# Patient Record
Sex: Female | Born: 1935 | Race: White | Hispanic: No | Smoking: Former smoker
Health system: Southern US, Community
[De-identification: ages and names within clinical notes are randomized; demographics above are authoritative.]

## PROBLEM LIST (undated history)

## (undated) DIAGNOSIS — R059 Cough, unspecified: Secondary | ICD-10-CM

## (undated) DIAGNOSIS — R928 Other abnormal and inconclusive findings on diagnostic imaging of breast: Secondary | ICD-10-CM

## (undated) DIAGNOSIS — K219 Gastro-esophageal reflux disease without esophagitis: Secondary | ICD-10-CM

## (undated) DIAGNOSIS — C801 Malignant (primary) neoplasm, unspecified: Secondary | ICD-10-CM

## (undated) DIAGNOSIS — K5790 Diverticulosis of intestine, part unspecified, without perforation or abscess without bleeding: Secondary | ICD-10-CM

## (undated) DIAGNOSIS — H9190 Unspecified hearing loss, unspecified ear: Secondary | ICD-10-CM

## (undated) DIAGNOSIS — N63 Unspecified lump in unspecified breast: Secondary | ICD-10-CM

## (undated) DIAGNOSIS — M1990 Unspecified osteoarthritis, unspecified site: Secondary | ICD-10-CM

## (undated) DIAGNOSIS — K2270 Barrett's esophagus without dysplasia: Secondary | ICD-10-CM

## (undated) DIAGNOSIS — R05 Cough: Secondary | ICD-10-CM

## (undated) DIAGNOSIS — I10 Essential (primary) hypertension: Secondary | ICD-10-CM

## (undated) HISTORY — PX: COLON SURGERY: SHX602

## (undated) HISTORY — DX: Gastro-esophageal reflux disease without esophagitis: K21.9

## (undated) HISTORY — PX: ABDOMINAL HYSTERECTOMY: SHX81

## (undated) HISTORY — DX: Cough: R05

## (undated) HISTORY — DX: Essential (primary) hypertension: I10

## (undated) HISTORY — DX: Unspecified lump in unspecified breast: N63.0

## (undated) HISTORY — DX: Cough, unspecified: R05.9

## (undated) HISTORY — DX: Unspecified osteoarthritis, unspecified site: M19.90

## (undated) HISTORY — DX: Other abnormal and inconclusive findings on diagnostic imaging of breast: R92.8

## (undated) HISTORY — DX: Malignant (primary) neoplasm, unspecified: C80.1

## (undated) HISTORY — DX: Diverticulosis of intestine, part unspecified, without perforation or abscess without bleeding: K57.90

## (undated) HISTORY — PX: APPENDECTOMY: SHX54

## (undated) HISTORY — DX: Barrett's esophagus without dysplasia: K22.70

---

## 1963-04-25 DIAGNOSIS — M1990 Unspecified osteoarthritis, unspecified site: Secondary | ICD-10-CM

## 1963-04-25 HISTORY — DX: Unspecified osteoarthritis, unspecified site: M19.90

## 1998-09-01 ENCOUNTER — Other Ambulatory Visit: Admission: RE | Admit: 1998-09-01 | Discharge: 1998-09-01 | Payer: Self-pay | Admitting: Obstetrics & Gynecology

## 1999-09-08 ENCOUNTER — Other Ambulatory Visit: Admission: RE | Admit: 1999-09-08 | Discharge: 1999-09-08 | Payer: Self-pay | Admitting: Obstetrics & Gynecology

## 1999-10-11 ENCOUNTER — Encounter: Admission: RE | Admit: 1999-10-11 | Discharge: 1999-10-11 | Payer: Self-pay | Admitting: Obstetrics & Gynecology

## 1999-10-11 ENCOUNTER — Encounter: Payer: Self-pay | Admitting: Obstetrics & Gynecology

## 1999-11-04 ENCOUNTER — Ambulatory Visit (HOSPITAL_COMMUNITY): Admission: RE | Admit: 1999-11-04 | Discharge: 1999-11-04 | Payer: Self-pay | Admitting: Gastroenterology

## 2000-11-29 ENCOUNTER — Other Ambulatory Visit: Admission: RE | Admit: 2000-11-29 | Discharge: 2000-11-29 | Payer: Self-pay | Admitting: Obstetrics & Gynecology

## 2002-07-28 ENCOUNTER — Encounter: Payer: Self-pay | Admitting: Unknown Physician Specialty

## 2002-07-28 ENCOUNTER — Encounter: Admission: RE | Admit: 2002-07-28 | Discharge: 2002-07-28 | Payer: Self-pay | Admitting: Unknown Physician Specialty

## 2003-08-03 ENCOUNTER — Encounter: Admission: RE | Admit: 2003-08-03 | Discharge: 2003-08-03 | Payer: Self-pay | Admitting: Unknown Physician Specialty

## 2003-12-01 ENCOUNTER — Observation Stay (HOSPITAL_COMMUNITY): Admission: EM | Admit: 2003-12-01 | Discharge: 2003-12-02 | Payer: Self-pay | Admitting: Emergency Medicine

## 2005-03-22 ENCOUNTER — Ambulatory Visit (HOSPITAL_COMMUNITY): Admission: RE | Admit: 2005-03-22 | Discharge: 2005-03-22 | Payer: Self-pay | Admitting: Gastroenterology

## 2006-01-08 ENCOUNTER — Emergency Department (HOSPITAL_COMMUNITY): Admission: EM | Admit: 2006-01-08 | Discharge: 2006-01-09 | Payer: Self-pay | Admitting: Emergency Medicine

## 2006-04-24 DIAGNOSIS — K5790 Diverticulosis of intestine, part unspecified, without perforation or abscess without bleeding: Secondary | ICD-10-CM

## 2006-04-24 HISTORY — DX: Diverticulosis of intestine, part unspecified, without perforation or abscess without bleeding: K57.90

## 2007-04-25 DIAGNOSIS — K2270 Barrett's esophagus without dysplasia: Secondary | ICD-10-CM

## 2007-04-25 HISTORY — DX: Barrett's esophagus without dysplasia: K22.70

## 2007-06-23 ENCOUNTER — Emergency Department (HOSPITAL_COMMUNITY): Admission: EM | Admit: 2007-06-23 | Discharge: 2007-06-24 | Payer: Self-pay | Admitting: Emergency Medicine

## 2009-05-01 ENCOUNTER — Emergency Department (HOSPITAL_BASED_OUTPATIENT_CLINIC_OR_DEPARTMENT_OTHER): Admission: EM | Admit: 2009-05-01 | Discharge: 2009-05-01 | Payer: Self-pay | Admitting: Emergency Medicine

## 2010-04-24 DIAGNOSIS — C801 Malignant (primary) neoplasm, unspecified: Secondary | ICD-10-CM

## 2010-04-24 DIAGNOSIS — I10 Essential (primary) hypertension: Secondary | ICD-10-CM

## 2010-04-24 HISTORY — DX: Malignant (primary) neoplasm, unspecified: C80.1

## 2010-04-24 HISTORY — DX: Essential (primary) hypertension: I10

## 2010-05-15 ENCOUNTER — Encounter: Payer: Self-pay | Admitting: Internal Medicine

## 2010-07-10 LAB — DIFFERENTIAL
Basophils Relative: 1 % (ref 0–1)
Eosinophils Absolute: 0 10*3/uL (ref 0.0–0.7)
Eosinophils Relative: 0 % (ref 0–5)
Lymphocytes Relative: 21 % (ref 12–46)
Lymphs Abs: 1.8 10*3/uL (ref 0.7–4.0)
Neutro Abs: 6.5 10*3/uL (ref 1.7–7.7)
Neutrophils Relative %: 75 % (ref 43–77)

## 2010-07-10 LAB — COMPREHENSIVE METABOLIC PANEL
AST: 19 U/L (ref 0–37)
Albumin: 4.1 g/dL (ref 3.5–5.2)
Alkaline Phosphatase: 57 U/L (ref 39–117)
CO2: 27 mEq/L (ref 19–32)
Chloride: 104 mEq/L (ref 96–112)
Creatinine, Ser: 0.8 mg/dL (ref 0.4–1.2)
Glucose, Bld: 141 mg/dL — ABNORMAL HIGH (ref 70–99)
Total Bilirubin: 0.6 mg/dL (ref 0.3–1.2)
Total Protein: 6.9 g/dL (ref 6.0–8.3)

## 2010-07-10 LAB — URINALYSIS, ROUTINE W REFLEX MICROSCOPIC
Ketones, ur: 15 mg/dL — AB
Protein, ur: NEGATIVE mg/dL

## 2010-07-10 LAB — CBC
Platelets: 236 10*3/uL (ref 150–400)
RBC: 3.81 MIL/uL — ABNORMAL LOW (ref 3.87–5.11)
RDW: 12.8 % (ref 11.5–15.5)
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
## 2010-09-09 NOTE — Procedures (Signed)
[facility]  Patient:    Kristen Allen, Kristen Allen                       MRN: [id number] Proc. Date: 11/04/99 Adm. Date:  [id number] Disc. Date: [id number] Attending:  Louie Bun CC:         Lennis P. Darrold Span, M.D.                           Procedure Report  PROCEDURE:  Colonoscopy.  INDICATION FOR PROCEDURE:  History of colon cancer with last surveillance colonoscopy three years ago.  DESCRIPTION OF PROCEDURE:  The patient was placed in the left lateral decubitus position and placed on the pulse monitor with continuous low-flow oxygen delivered by nasal cannula.  She was sedated with 70 mg IV Demerol and 7 mg IV Versed.  The Olympus video colonoscope was inserted into the rectum and advanced to the ileocolonic surgical anastomosis in the right upper abdomen.  This was confirmed by intubation of the ileum.  The prep was excellent.  The proximal portions of the colon as well as the remaining transverse, descending, and sigmoid colon all appeared normal with no masses, polyps, diverticula, or other mucosal abnormalities.  The rectum likewise appeared normal, and retroflex view of the anus did reveal some small internal hemorrhoids.  The colonoscope was then withdrawn and the patient returned to the recovery room in stable condition.  The patient tolerated the procedure well, and there were no immediate complications.  IMPRESSION:  Internal hemorrhoids, otherwise normal colonoscopy.  PLAN:  Repeat colonoscopy in five years. DD:  11/04/99 TD:  11/04/99 Job: [id number]
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
## 2010-09-09 NOTE — Op Note (Signed)
NAMEBRYLA, Kristen Allen                ACCOUNT NO.:  [id number]   MEDICAL RECORD NO.:  [id number]          PATIENT TYPE:  AMB   LOCATION:  ENDO                         [facility]AC[facility]  [facility]   PHYSICIAN:  John C. Madilyn Fireman, M.D.    DATE OF BIRTH:  1931/09/26   DATE OF PROCEDURE:  03/22/2005  DATE OF DISCHARGE:                                 OPERATIVE REPORT   PROCEDURE:  Colonoscopy.   ENDOSCOPIST:  Everardo All. Madilyn Fireman, M.D.   INDICATIONS FOR PROCEDURE:  History of colon cancer with last surveillance  colonoscopy five years ago.   PROCEDURE:  The patient was placed in the left lateral decubitus position  and placed on the pulse monitor  He was sedated with her. She was sedated  with 75 mcg IV fentanyl and 7.5 mg IV Versed.  The Olympus video colonoscope  was inserted into the rectum and advanced to the ileocolonic anastomosis  which was easily identified and photographed.  In the right lower quadrant,  the neoterminal was intubated.  It appeared normal. The ileocolonic  anastomosis all appeared normal.  The visualized portions of the remaining  descending and transverse as well as the descending sigmoid and rectum all  appeared normal with no masses, polyps, diverticula or other mucosal  abnormalities.  The rectum appeared normal on retroflexed view.  The anus  revealed no obvious internal hemorrhoids. The scope was then withdrawn and  the patient returned to the recovery room in stable condition.  The patient  did experience some discomfort on advancement of the scope to the  ileocolonic anastomosis but otherwise tolerated the procedure well, and  there were no immediate complications.   IMPRESSION:  Normal colonoscopy, status post right hemicolectomy in 1993 for  colon cancer.   PLAN:  Repeat study in five years.           ______________________________  Everardo All Madilyn Fireman, M.D.     JCH/MEDQ  D:  03/22/2005  T:  03/22/2005  Job:  [id number]   cc:   Lennis P. Darrold Span, M.D.  Fax: [phone]

## 2010-10-03 ENCOUNTER — Emergency Department (HOSPITAL_BASED_OUTPATIENT_CLINIC_OR_DEPARTMENT_OTHER)
Admission: EM | Admit: 2010-10-03 | Discharge: 2010-10-03 | Disposition: A | Discharge status: Home / Self Care | Payer: Medicare Other | Attending: Emergency Medicine | Admitting: Emergency Medicine

## 2010-10-03 DIAGNOSIS — K219 Gastro-esophageal reflux disease without esophagitis: Secondary | ICD-10-CM | POA: Insufficient documentation

## 2010-10-03 DIAGNOSIS — I10 Essential (primary) hypertension: Secondary | ICD-10-CM | POA: Insufficient documentation

## 2010-10-03 DIAGNOSIS — N390 Urinary tract infection, site not specified: Secondary | ICD-10-CM | POA: Insufficient documentation

## 2010-10-03 DIAGNOSIS — R300 Dysuria: Secondary | ICD-10-CM | POA: Insufficient documentation

## 2010-10-03 LAB — URINALYSIS, ROUTINE W REFLEX MICROSCOPIC
Bilirubin Urine: NEGATIVE
Glucose, UA: NEGATIVE mg/dL
Nitrite: NEGATIVE
Protein, ur: 30 mg/dL — AB
Urobilinogen, UA: 0.2 mg/dL (ref 0.0–1.0)
pH: 6 (ref 5.0–8.0)

## 2010-10-03 LAB — URINE MICROSCOPIC-ADD ON

## 2010-10-05 LAB — URINE CULTURE: Colony Count: >=100000

## 2011-01-16 LAB — DIFFERENTIAL
Basophils Absolute: 0
Basophils Relative: 0
Eosinophils Absolute: 0
Eosinophils Relative: 0
Lymphocytes Relative: 4 — ABNORMAL LOW
Lymphs Abs: 0.3 — ABNORMAL LOW
Monocytes Absolute: 0 — ABNORMAL LOW
Neutro Abs: 6.4

## 2011-01-16 LAB — COMPREHENSIVE METABOLIC PANEL
Calcium: 8.9
Potassium: 3.3 — ABNORMAL LOW
Sodium: 141

## 2011-01-16 LAB — LIPASE, BLOOD: Lipase: 22

## 2011-01-16 LAB — URINALYSIS, ROUTINE W REFLEX MICROSCOPIC
Ketones, ur: NEGATIVE
Nitrite: NEGATIVE
Specific Gravity, Urine: 1.014

## 2011-01-16 LAB — CBC
HCT: 33 — ABNORMAL LOW
Hemoglobin: 11.4 — ABNORMAL LOW
MCHC: 34.6
Platelets: 217
RBC: 3.58 — ABNORMAL LOW

## 2012-05-23 ENCOUNTER — Encounter: Payer: Self-pay | Admitting: *Deleted

## 2017-11-02 ENCOUNTER — Other Ambulatory Visit: Payer: Self-pay | Admitting: Family Medicine

## 2017-11-02 ENCOUNTER — Ambulatory Visit: Payer: Self-pay

## 2017-11-02 DIAGNOSIS — M25561 Pain in right knee: Secondary | ICD-10-CM

## 2018-01-16 ENCOUNTER — Emergency Department (HOSPITAL_BASED_OUTPATIENT_CLINIC_OR_DEPARTMENT_OTHER)
Admission: EM | Admit: 2018-01-16 | Discharge: 2018-01-16 | Disposition: A | Discharge status: Home / Self Care | Payer: Medicare Other | Attending: Emergency Medicine | Admitting: Emergency Medicine

## 2018-01-16 ENCOUNTER — Emergency Department (HOSPITAL_BASED_OUTPATIENT_CLINIC_OR_DEPARTMENT_OTHER): Payer: Medicare Other

## 2018-01-16 ENCOUNTER — Encounter (HOSPITAL_BASED_OUTPATIENT_CLINIC_OR_DEPARTMENT_OTHER): Payer: Self-pay | Admitting: Emergency Medicine

## 2018-01-16 ENCOUNTER — Other Ambulatory Visit: Payer: Self-pay

## 2018-01-16 DIAGNOSIS — R112 Nausea with vomiting, unspecified: Secondary | ICD-10-CM | POA: Insufficient documentation

## 2018-01-16 DIAGNOSIS — Z79899 Other long term (current) drug therapy: Secondary | ICD-10-CM | POA: Diagnosis not present

## 2018-01-16 DIAGNOSIS — R509 Fever, unspecified: Secondary | ICD-10-CM | POA: Insufficient documentation

## 2018-01-16 DIAGNOSIS — Z853 Personal history of malignant neoplasm of breast: Secondary | ICD-10-CM | POA: Insufficient documentation

## 2018-01-16 DIAGNOSIS — M545 Low back pain, unspecified: Secondary | ICD-10-CM

## 2018-01-16 DIAGNOSIS — Z87891 Personal history of nicotine dependence: Secondary | ICD-10-CM | POA: Diagnosis not present

## 2018-01-16 DIAGNOSIS — I10 Essential (primary) hypertension: Secondary | ICD-10-CM | POA: Insufficient documentation

## 2018-01-16 LAB — CBC WITH DIFFERENTIAL/PLATELET
Basophils Absolute: 0 10*3/uL (ref 0.0–0.1)
Basophils Relative: 0 %
EOS ABS: 0 10*3/uL (ref 0.0–0.7)
Eosinophils Relative: 2 %
HEMATOCRIT: 36.6 % (ref 36.0–46.0)
HEMOGLOBIN: 12 g/dL (ref 12.0–15.0)
LYMPHS PCT: 56 %
Lymphs Abs: 0.8 10*3/uL (ref 0.7–4.0)
MCH: 31.1 pg (ref 26.0–34.0)
MCHC: 32.8 g/dL (ref 30.0–36.0)
MCV: 94.8 fL (ref 78.0–100.0)
MONOS PCT: 0 %
Monocytes Absolute: 0 10*3/uL — ABNORMAL LOW (ref 0.1–1.0)
NEUTROS ABS: 0.5 10*3/uL — AB (ref 1.7–7.7)
NEUTROS PCT: 42 %
Platelets: 162 10*3/uL (ref 150–400)
RBC: 3.86 MIL/uL — AB (ref 3.87–5.11)
RDW: 14.7 % (ref 11.5–15.5)
WBC: 1.3 10*3/uL — AB (ref 4.0–10.5)

## 2018-01-16 LAB — COMPREHENSIVE METABOLIC PANEL
ALK PHOS: 74 U/L (ref 38–126)
ALT: 24 U/L (ref 0–44)
ANION GAP: 10 (ref 5–15)
AST: 37 U/L (ref 15–41)
Albumin: 4.1 g/dL (ref 3.5–5.0)
BILIRUBIN TOTAL: 0.5 mg/dL (ref 0.3–1.2)
BUN: 19 mg/dL (ref 8–23)
CALCIUM: 9.2 mg/dL (ref 8.9–10.3)
CO2: 25 mmol/L (ref 22–32)
Chloride: 109 mmol/L (ref 98–111)
Creatinine, Ser: 1.06 mg/dL — ABNORMAL HIGH (ref 0.44–1.00)
GFR calc Af Amer: 54 mL/min — ABNORMAL LOW (ref >60)
GFR calc non Af Amer: 47 mL/min — ABNORMAL LOW (ref >60)
GLUCOSE: 110 mg/dL — AB (ref 70–99)
Potassium: 3 mmol/L — ABNORMAL LOW (ref 3.5–5.1)
SODIUM: 144 mmol/L (ref 135–145)
TOTAL PROTEIN: 6.8 g/dL (ref 6.5–8.1)

## 2018-01-16 LAB — URINALYSIS, ROUTINE W REFLEX MICROSCOPIC
Bilirubin Urine: NEGATIVE
GLUCOSE, UA: NEGATIVE mg/dL
KETONES UR: NEGATIVE mg/dL
LEUKOCYTES UA: NEGATIVE
NITRITE: NEGATIVE
PROTEIN: NEGATIVE mg/dL
Specific Gravity, Urine: <1.005 — ABNORMAL LOW (ref 1.005–1.030)
pH: 5.5 (ref 5.0–8.0)

## 2018-01-16 LAB — URINALYSIS, MICROSCOPIC (REFLEX)

## 2018-01-16 LAB — I-STAT CG4 LACTIC ACID, ED: LACTIC ACID, VENOUS: 3.5 mmol/L — AB (ref 0.5–1.9)

## 2018-01-16 LAB — LIPASE, BLOOD: Lipase: 42 U/L (ref 11–51)

## 2018-01-16 LAB — TROPONIN I: Troponin I: <0.03 ng/mL (ref <0.03)

## 2018-01-16 MED ORDER — ACETAMINOPHEN 500 MG PO TABS
1000.0000 mg | ORAL_TABLET | Freq: Once | ORAL | Status: AC
  Administered 2018-01-16: 1000 mg via ORAL
  Filled 2018-01-16: qty 2

## 2018-01-16 MED ORDER — SODIUM CHLORIDE 0.9 % IV BOLUS
1000.0000 mL | Freq: Once | INTRAVENOUS | Status: AC
  Administered 2018-01-16: 1000 mL via INTRAVENOUS

## 2018-01-16 MED ORDER — IOPAMIDOL (ISOVUE-300) INJECTION 61%
80.0000 mL | Freq: Once | INTRAVENOUS | Status: AC | PRN
  Administered 2018-01-16: 80 mL via INTRAVENOUS

## 2018-01-16 MED ORDER — ONDANSETRON 4 MG PO TBDP: ORAL_TABLET | ORAL | 0 refills | Status: DC

## 2018-01-16 MED ORDER — IBUPROFEN 800 MG PO TABS
800.0000 mg | ORAL_TABLET | Freq: Once | ORAL | Status: AC
  Administered 2018-01-16: 800 mg via ORAL
  Filled 2018-01-16: qty 1

## 2018-01-16 MED ORDER — ONDANSETRON HCL 4 MG/2ML IJ SOLN
4.0000 mg | Freq: Once | INTRAMUSCULAR | Status: AC
  Administered 2018-01-16: 4 mg via INTRAVENOUS
  Filled 2018-01-16: qty 2

## 2018-01-16 NOTE — ED Triage Notes (Signed)
Pt states she got a flu shot yesterday and this evening around 1815 she started having chills, with nausea and vomiting  Pt had a loose BM once she got here  Pt states she feels weak all over

## 2018-01-16 NOTE — Discharge Instructions (Signed)
Take 4 over the counter ibuprofen tablets 3 times a day or 2 over-the-counter naproxen tablets twice a day for pain. Also take tylenol 1000mg (2 extra strength) four times a day.    Return to the ED for any worsening, inability to eat or drink. Please follow up with your family doc.

## 2018-01-16 NOTE — ED Notes (Signed)
Patient transported to CT 

## 2018-01-16 NOTE — ED Provider Notes (Signed)
[facility] EMERGENCY DEPARTMENT Provider Note   CSN: [id number] Arrival date & time: 01/16/18  1939     History   Chief Complaint Chief Complaint  Patient presents with  . Fever  . Emesis    HPI Kristen Allen is a 84 y.o. female.  84 yo F with a chief complaint of nausea and vomiting.  Going on for the last hour.  The family states that she is looked very pale with this.  She denies abdominal pain denies fevers denies diarrhea.  She has been previously constipated and felt that she had her first bowel movement today.  Denies chest pain or shortness of breath.  She is had some low back pain that she feels is worse with the vomiting worse with twisting or palpation.  Describes his back pain is similar to when she has her normal back aches when she does exercise or moves around the house.  The history is provided by the patient and a relative.  Fever   Associated symptoms include vomiting. Pertinent negatives include no chest pain, no congestion and no headaches.  Emesis   Pertinent negatives include no arthralgias, no chills, no fever, no headaches and no myalgias.  Illness  This is a new problem. The current episode started less than 1 hour ago. The problem occurs constantly. The problem has not changed since onset.Pertinent negatives include no chest pain, no headaches and no shortness of breath. Nothing aggravates the symptoms. Nothing relieves the symptoms. She has tried nothing for the symptoms. The treatment provided no relief.    Past Medical History:  Diagnosis Date  . Abnormal mammogram   . Arthritis 1965  . Barrett esophagus 2009  . Breast lump in female   . Cancer ([place]) 2012   squamous cell lower left breast  . Cough   . Diverticulosis 2008  . Esophageal reflux   . Hypertension 2012    There are no active problems to display for this patient.   Past Surgical History:  Procedure Laterality Date  . ABDOMINAL HYSTERECTOMY    . APPENDECTOMY    .  COLON SURGERY       OB History   None      Home Medications    Prior to Admission medications   Medication Sig Start Date End Date Taking? Authorizing Provider  Calcium Carbonate-Vitamin D (CALCIUM 500/D PO) Take 2 tablets by mouth daily.    [provider]  letrozole (FEMARA) 2.5 MG tablet Take 2.5 mg by mouth daily.    [provider]  metoprolol succinate (TOPROL-XL) 25 MG 24 hr tablet Take 25 mg by mouth 2 (two) times daily.    [provider]  ondansetron (ZOFRAN ODT) 4 MG disintegrating tablet 4mg  ODT q4 hours prn nausea/vomit 01/16/18   Deno Etienne, DO  pantoprazole (PROTONIX) 40 MG tablet Take 40 mg by mouth 2 (two) times daily.    [provider]  rosuvastatin (CRESTOR) 5 MG tablet Take 5 mg by mouth daily.    [provider]    Family History History reviewed. No pertinent family history.  Social History Social History   Tobacco Use  . Smoking status: Former Smoker    Packs/day: 1.00    Years: 30.00    Pack years: 30.00    Types: Cigarettes  . Smokeless tobacco: Never Used  Substance Use Topics  . Alcohol use: No  . Drug use: No     Allergies   Codeine and Erythromycin  Review of Systems Review of Systems  Constitutional: Negative for chills and fever.  HENT: Negative for congestion and rhinorrhea.   Eyes: Negative for redness and visual disturbance.  Respiratory: Negative for shortness of breath and wheezing.   Cardiovascular: Negative for chest pain and palpitations.  Gastrointestinal: Positive for nausea and vomiting.  Genitourinary: Negative for dysuria and urgency.  Musculoskeletal: Positive for back pain. Negative for arthralgias and myalgias.  Skin: Negative for pallor and wound.  Neurological: Negative for dizziness and headaches.     Physical Exam Updated Vital Signs BP 121/75 (BP Location: Left Arm)   Pulse 80   Temp 98.5 F (36.9 C) (Oral)   Resp 18   Ht 5' 6.5" (1.689 m)   Wt 68 kg    SpO2 93%   BMI 23.85 kg/m   Physical Exam  Constitutional: She is oriented to person, place, and time. She appears well-developed and well-nourished. No distress.  HENT:  Head: Normocephalic and atraumatic.  Eyes: Pupils are equal, round, and reactive to light. EOM are normal.  Neck: Normal range of motion. Neck supple.  Cardiovascular: Normal rate and regular rhythm. Exam reveals no gallop and no friction rub.  No murmur heard. Pulmonary/Chest: Effort normal. She has no wheezes. She has no rales.  Abdominal: Soft. She exhibits no distension and no mass. There is no tenderness. There is no guarding.  Musculoskeletal: She exhibits no edema or tenderness.  Neurological: She is alert and oriented to person, place, and time.  Skin: Skin is warm and dry. She is not diaphoretic.  Psychiatric: She has a normal mood and affect. Her behavior is normal.  Nursing note and vitals reviewed.    ED Treatments / Results  Labs (all labs ordered are listed, but only abnormal results are displayed) Labs Reviewed  CBC WITH DIFFERENTIAL/PLATELET - Abnormal; Notable for the following components:      Result Value   WBC 1.3 (*)    RBC 3.86 (*)    Neutro Abs 0.5 (*)    Monocytes Absolute 0.0 (*)    All other components within normal limits  COMPREHENSIVE METABOLIC PANEL - Abnormal; Notable for the following components:   Potassium 3.0 (*)    Glucose, Bld 110 (*)    Creatinine, Ser 1.06 (*)    GFR calc non Af Amer 47 (*)    GFR calc Af Amer 54 (*)    All other components within normal limits  URINALYSIS, ROUTINE W REFLEX MICROSCOPIC - Abnormal; Notable for the following components:   Specific Gravity, Urine <1.005 (*)    Hgb urine dipstick TRACE (*)    All other components within normal limits  URINALYSIS, MICROSCOPIC (REFLEX) - Abnormal; Notable for the following components:   Bacteria, UA RARE (*)    All other components within normal limits  I-STAT CG4 LACTIC ACID, ED - Abnormal; Notable for  the following components:   Lactic Acid, Venous 3.50 (*)    All other components within normal limits  LIPASE, BLOOD  TROPONIN I  PATHOLOGIST SMEAR REVIEW  I-STAT CG4 LACTIC ACID, ED    EKG EKG Interpretation  Date/Time:  Wednesday January 16 2018 20:13:00 EDT Ventricular Rate:  109 PR Interval:    QRS Duration: 104 QT Interval:  374 QTC Calculation: 504 R Axis:   93 Text Interpretation:  Sinus tachycardia Right axis deviation Prolonged QT interval No significant change since last tracing Confirmed by Deno Etienne [phone]) on 01/16/2018 8:16:43 PM   Radiology Dg Chest 2 View  Result Date: 01/16/2018 CLINICAL DATA:  Shortness of breath EXAM: CHEST - 2 VIEW COMPARISON:  06/26/2016 FINDINGS: Bibasilar atelectasis. Heart is normal size. No effusions or acute bony abnormality. IMPRESSION: Bibasilar atelectasis. Electronically Signed   By: Rolm Baptise M.D.   On: 01/16/2018 21:00   Ct Abdomen Pelvis W Contrast  Result Date: 01/16/2018 CLINICAL DATA:  Chills, nausea and vomiting after flu shot yesterday. Diarrhea. Weakness. History of breast cancer, hysterectomy, appendectomy in colon surgery. EXAM: CT ABDOMEN AND PELVIS WITH CONTRAST TECHNIQUE: Multidetector CT imaging of the abdomen and pelvis was performed using the standard protocol following bolus administration of intravenous contrast. CONTRAST:  27mL ISOVUE-300 IOPAMIDOL (ISOVUE-300) INJECTION 61% COMPARISON:  Chest radiograph June 23, 2007. FINDINGS: LOWER CHEST: Dependent atelectasis. Included heart size is normal. No pericardial effusion. HEPATOBILIARY: Liver and gallbladder are normal. Mild periportal edema, nonspecific and non suspicious. PANCREAS: Normal. SPLEEN: Normal. ADRENALS/URINARY TRACT: Kidneys are orthotopic, demonstrating symmetric enhancement. No nephrolithiasis, hydronephrosis or solid renal masses. The unopacified ureters are normal in course and caliber. Delayed imaging through the kidneys demonstrates symmetric prompt  contrast excretion within the proximal urinary collecting system. Urinary bladder is partially distended and unremarkable. Normal adrenal glands. STOMACH/BOWEL: Small to moderate hiatal hernia. The stomach, small bowel are normal in course and caliber without inflammatory changes. Status post RIGHT hemicolectomy. VASCULAR/LYMPHATIC: Aortoiliac vessels are normal in course and caliber. Mild calcific atherosclerosis. No lymphadenopathy by CT size criteria. REPRODUCTIVE: Status post hysterectomy. OTHER: No intraperitoneal free fluid or free air. MUSCULOSKELETAL: Nonacute. Moderate to severe L4-5 and L5-S1 degenerative disc. Anterior abdominal wall scarring. Tarlov cyst. IMPRESSION: 1. No acute intra-abdominal/pelvic process. 2. Small to moderate hiatal hernia. Status post RIGHT hemicolectomy. Aortic Atherosclerosis (ICD10-I70.0). Electronically Signed   By: Elon Alas M.D.   On: 01/16/2018 21:09    Procedures Procedures (including critical care time)  Medications Ordered in ED Medications  ondansetron (ZOFRAN) injection 4 mg (4 mg Intravenous Given 01/16/18 1959)  sodium chloride 0.9 % bolus 1,000 mL ( Intravenous Stopped 01/16/18 2239)  acetaminophen (TYLENOL) tablet 1,000 mg (1,000 mg Oral Given 01/16/18 2012)  iopamidol (ISOVUE-300) 61 % injection 80 mL (80 mLs Intravenous Contrast Given 01/16/18 2039)  ibuprofen (ADVIL,MOTRIN) tablet 800 mg (800 mg Oral Given 01/16/18 2214)     Initial Impression / Assessment and Plan / ED Course  I have reviewed the triage vital signs and the nursing notes.  Pertinent labs & imaging results that were available during my care of the patient were reviewed by me and considered in my medical decision making (see chart for details).  Clinical Course as of Jan 16 2299  Wed Jan 16, 2018  2156 CT scan is negative for acute pathology.  The patient is now tolerating by mouth and feels much better.  Feels that her low back pain is significantly improved as well.  Her  potassium is mildly low at 3, her white blood cell count was 1.3.  Awaiting a UA.   [DF]    Clinical Course User Index [DF] Deno Etienne, DO    84 yo F with a chief complaint of nausea and vomiting.  This started acutely over the past hour.  Patient appears significantly uncomfortable though I think this is all related to her nausea.  She had a lactate of 3.5 initially.  Was noted to be almost febrile with a temp of 100.1.  We will give a gram of Tylenol give fluids and Zofran.  With patient appearing to be significantly uncomfortable we will get  a troponin as well as a CT scan of the abdomen pelvis.  UA without infection.  Patient continues to feel well.  Has been tolerating by mouth without issue.  Will ambulate in the ED and see how she does.  The patient was able to ambulate without hypoxia.  She continues to feel well and is requesting discharge home.  She actually would like to go to work tomorrow.  I discussed with her return precautions.  11:00 PM:  I have discussed the diagnosis/risks/treatment options with the patient and family and believe the pt to be eligible for discharge home to follow-up with PCP. We also discussed returning to the ED immediately if new or worsening sx occur. We discussed the sx which are most concerning (e.g., sudden worsening pain, fever, inability to tolerate by mouth) that necessitate immediate return. Medications administered to the patient during their visit and any new prescriptions provided to the patient are listed below.  Medications given during this visit Medications  ondansetron (ZOFRAN) injection 4 mg (4 mg Intravenous Given 01/16/18 1959)  sodium chloride 0.9 % bolus 1,000 mL ( Intravenous Stopped 01/16/18 2239)  acetaminophen (TYLENOL) tablet 1,000 mg (1,000 mg Oral Given 01/16/18 2012)  iopamidol (ISOVUE-300) 61 % injection 80 mL (80 mLs Intravenous Contrast Given 01/16/18 2039)  ibuprofen (ADVIL,MOTRIN) tablet 800 mg (800 mg Oral Given 01/16/18 2214)        The patient appears reasonably screen and/or stabilized for discharge and I doubt any other medical condition or other [facility] requiring further screening, evaluation, or treatment in the ED at this time prior to discharge.    Final Clinical Impressions(s) / ED Diagnoses   Final diagnoses:  Nausea and vomiting in adult  Acute bilateral low back pain without sciatica    ED Discharge Orders         Ordered    ondansetron (ZOFRAN ODT) 4 MG disintegrating tablet     01/16/18 2246           Deno Etienne, DO 01/16/18 2300

## 2018-01-16 NOTE — ED Notes (Signed)
Date and time results received: 01/16/18 2024 (use smartphrase ".now" to insert current time)  Test: WBC Critical Value: 1.3  Name of Provider Notified: Dr. Tyrone Nine  Orders Received? Or Actions Taken?: no new orders

## 2018-01-16 NOTE — ED Notes (Signed)
95-93% o2 sat while ambulating in department; no acute distress noted

## 2018-01-16 NOTE — Progress Notes (Signed)
Patient's lactic acid resulted at 3.50 mmol/L.   Dr Tyrone Nine notified of results.

## 2018-01-17 LAB — PATHOLOGIST SMEAR REVIEW

## 2018-01-18 ENCOUNTER — Other Ambulatory Visit: Payer: Self-pay

## 2018-01-18 ENCOUNTER — Emergency Department (HOSPITAL_BASED_OUTPATIENT_CLINIC_OR_DEPARTMENT_OTHER)
Admission: EM | Admit: 2018-01-18 | Discharge: 2018-01-18 | Disposition: A | Discharge status: Other Acute Inpatient Hospital | Payer: Medicare Other | Attending: Emergency Medicine | Admitting: Emergency Medicine

## 2018-01-18 ENCOUNTER — Encounter (HOSPITAL_BASED_OUTPATIENT_CLINIC_OR_DEPARTMENT_OTHER): Payer: Self-pay | Admitting: Emergency Medicine

## 2018-01-18 DIAGNOSIS — D696 Thrombocytopenia, unspecified: Secondary | ICD-10-CM

## 2018-01-18 DIAGNOSIS — I10 Essential (primary) hypertension: Secondary | ICD-10-CM | POA: Diagnosis not present

## 2018-01-18 DIAGNOSIS — Z79899 Other long term (current) drug therapy: Secondary | ICD-10-CM | POA: Diagnosis not present

## 2018-01-18 DIAGNOSIS — N179 Acute kidney failure, unspecified: Secondary | ICD-10-CM | POA: Diagnosis not present

## 2018-01-18 DIAGNOSIS — N390 Urinary tract infection, site not specified: Secondary | ICD-10-CM | POA: Insufficient documentation

## 2018-01-18 DIAGNOSIS — L539 Erythematous condition, unspecified: Secondary | ICD-10-CM | POA: Diagnosis present

## 2018-01-18 DIAGNOSIS — I776 Arteritis, unspecified: Secondary | ICD-10-CM | POA: Insufficient documentation

## 2018-01-18 DIAGNOSIS — Z853 Personal history of malignant neoplasm of breast: Secondary | ICD-10-CM | POA: Insufficient documentation

## 2018-01-18 DIAGNOSIS — Z87891 Personal history of nicotine dependence: Secondary | ICD-10-CM | POA: Insufficient documentation

## 2018-01-18 LAB — COMPREHENSIVE METABOLIC PANEL
ALK PHOS: 65 U/L (ref 38–126)
ALT: 23 U/L (ref 0–44)
ANION GAP: 9 (ref 5–15)
AST: 43 U/L — ABNORMAL HIGH (ref 15–41)
Albumin: 3.3 g/dL — ABNORMAL LOW (ref 3.5–5.0)
BUN: 36 mg/dL — ABNORMAL HIGH (ref 8–23)
CO2: 23 mmol/L (ref 22–32)
CREATININE: 1.55 mg/dL — AB (ref 0.44–1.00)
Calcium: 7.8 mg/dL — ABNORMAL LOW (ref 8.9–10.3)
Chloride: 104 mmol/L (ref 98–111)
GFR, EST AFRICAN AMERICAN: 34 mL/min — AB (ref >60)
GFR, EST NON AFRICAN AMERICAN: 30 mL/min — AB (ref >60)
Glucose, Bld: 105 mg/dL — ABNORMAL HIGH (ref 70–99)
Potassium: 3.4 mmol/L — ABNORMAL LOW (ref 3.5–5.1)
SODIUM: 136 mmol/L (ref 135–145)
TOTAL PROTEIN: 6 g/dL — AB (ref 6.5–8.1)
Total Bilirubin: 1.1 mg/dL (ref 0.3–1.2)

## 2018-01-18 LAB — CBC WITH DIFFERENTIAL/PLATELET
BAND NEUTROPHILS: 14 %
BLASTS: 0 %
Basophils Absolute: 0 10*3/uL (ref 0.0–0.1)
Basophils Relative: 0 %
Eosinophils Absolute: 0.3 10*3/uL (ref 0.0–0.7)
Eosinophils Relative: 5 %
HEMATOCRIT: 34.8 % — AB (ref 36.0–46.0)
HEMOGLOBIN: 12 g/dL (ref 12.0–15.0)
LYMPHS PCT: 33 %
Lymphs Abs: 2 10*3/uL (ref 0.7–4.0)
MCH: 31.1 pg (ref 26.0–34.0)
MCHC: 34.5 g/dL (ref 30.0–36.0)
MCV: 90.2 fL (ref 78.0–100.0)
MONOS PCT: 1 %
Metamyelocytes Relative: 0 %
Monocytes Absolute: 0.1 10*3/uL (ref 0.1–1.0)
Myelocytes: 0 %
NEUTROS ABS: 3.6 10*3/uL (ref 1.7–7.7)
NEUTROS PCT: 47 %
NRBC: 0 /100{WBCs}
OTHER: 0 %
PROMYELOCYTES RELATIVE: 0 %
Platelets: 57 10*3/uL — ABNORMAL LOW (ref 150–400)
RBC: 3.86 MIL/uL — AB (ref 3.87–5.11)
RDW: 15 % (ref 11.5–15.5)
WBC: 6 10*3/uL (ref 4.0–10.5)

## 2018-01-18 LAB — URINALYSIS, ROUTINE W REFLEX MICROSCOPIC
BILIRUBIN URINE: NEGATIVE
GLUCOSE, UA: NEGATIVE mg/dL
Ketones, ur: NEGATIVE mg/dL
Nitrite: NEGATIVE
PROTEIN: NEGATIVE mg/dL
Specific Gravity, Urine: 1.01 (ref 1.005–1.030)
pH: 6 (ref 5.0–8.0)

## 2018-01-18 LAB — URINALYSIS, MICROSCOPIC (REFLEX): WBC, UA: >50 WBC/hpf (ref 0–5)

## 2018-01-18 LAB — PROTIME-INR
INR: 1.12
Prothrombin Time: 14.4 seconds (ref 11.4–15.2)

## 2018-01-18 MED ORDER — SODIUM CHLORIDE 0.9 % IV BOLUS
1000.0000 mL | Freq: Once | INTRAVENOUS | Status: AC
  Administered 2018-01-18: 1000 mL via INTRAVENOUS

## 2018-01-18 MED ORDER — SODIUM CHLORIDE 0.9 % IV SOLN
1.0000 g | Freq: Once | INTRAVENOUS | Status: AC
  Administered 2018-01-18: 1 g via INTRAVENOUS
  Filled 2018-01-18: qty 10

## 2018-01-18 NOTE — ED Notes (Signed)
Pt reports having splotches on her legs that have not been there before. Pt denies any pain or injury.

## 2018-01-18 NOTE — ED Triage Notes (Signed)
Reports "leg splotches" which began today.  States she has been taking 800mg  ibuprofen every 8 hours since being seen on Wednesday.  Denies pain.

## 2018-01-18 NOTE — ED Notes (Addendum)
Contact hositalist @ [facility]

## 2018-01-18 NOTE — ED Provider Notes (Addendum)
[facility] EMERGENCY DEPARTMENT Provider Note   CSN: [id number] Arrival date & time: 01/18/18  1757     History   Chief Complaint Chief Complaint  Patient presents with  . Leg Problem    HPI Kristen Allen is a 84 y.o. female.  The history is provided by the patient. No language interpreter was used.  Rash   This is a new problem. The current episode started yesterday. The problem has been gradually worsening. The problem is associated with nothing. There has been no fever. The patient is experiencing no pain. The pain has been constant since onset. She has tried nothing for the symptoms. The treatment provided no relief.   Pt was seen here 2 days ago for vomiting and fever.   Pt reports she now has a rash on her lower legs and arms.  Pt was seen here 2 days ago and had a decreased wbc count of 1.4.   Pt reports she has been taking ibuprofen with relief   Past Medical History:  Diagnosis Date  . Abnormal mammogram   . Arthritis 1965  . Barrett esophagus 2009  . Breast lump in female   . 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 Cough   . Diverticulosis 2008  . Esophageal reflux   . Hypertension 2012    There are no active problems to display for this patient.   Past Surgical History:  Procedure Laterality Date  . ABDOMINAL HYSTERECTOMY    . APPENDECTOMY    . COLON SURGERY       OB History   None      Home Medications    Prior to Admission medications   Medication Sig Start Date End Date Taking? Authorizing Provider  metoprolol succinate (TOPROL-XL) 25 MG 24 hr tablet Take 25 mg by mouth 2 (two) times daily.    [provider]    Family History History reviewed. No pertinent family history.  Social History Social History   Tobacco Use  . Smoking status: Former Smoker    Packs/day: 1.00    Years: 30.00    Pack years: 30.00    Types: Cigarettes  . Smokeless tobacco: Never Used  Substance Use Topics  . Alcohol  use: No  . Drug use: No     Allergies   Codeine and Erythromycin   Review of Systems Review of Systems  Skin: Positive for rash.  All other systems reviewed and are negative.    Physical Exam Updated Vital Signs BP 106/67   Pulse 78   Temp (!) 97.3 F (36.3 C) (Oral)   Resp 16   Ht 5' 6.5" (1.689 m)   Wt 68 kg   SpO2 96%   BMI 23.85 kg/m   Physical Exam  Constitutional: She is oriented to person, place, and time. She appears well-developed and well-nourished.  HENT:  Head: Normocephalic.  Right Ear: External ear normal.  Left Ear: External ear normal.  Eyes: Pupils are equal, round, and reactive to light. EOM are normal.  Neck: Normal range of motion.  Cardiovascular: Normal rate and regular rhythm.  Pulmonary/Chest: Effort normal.  Abdominal: She exhibits no distension.  Musculoskeletal: Normal range of motion.  Neurological: She is alert and oriented to person, place, and time.  Skin: There is erythema.  Psychiatric: She has a normal mood and affect.  Nursing note and vitals reviewed.    ED Treatments / Results  Labs (all labs ordered are listed, but  only abnormal results are displayed) Labs Reviewed  CBC WITH DIFFERENTIAL/PLATELET - Abnormal; Notable for the following components:      Result Value   RBC 3.86 (*)    HCT 34.8 (*)    Platelets 57 (*)    All other components within normal limits  COMPREHENSIVE METABOLIC PANEL - Abnormal; Notable for the following components:   Potassium 3.4 (*)    Glucose, Bld 105 (*)    BUN 36 (*)    Creatinine, Ser 1.55 (*)    Calcium 7.8 (*)    Total Protein 6.0 (*)    Albumin 3.3 (*)    AST 43 (*)    GFR calc non Af Amer 30 (*)    GFR calc Af Amer 34 (*)    All other components within normal limits  URINALYSIS, ROUTINE W REFLEX MICROSCOPIC - Abnormal; Notable for the following components:   APPearance CLOUDY (*)    Hgb urine dipstick SMALL (*)    Leukocytes, UA LARGE (*)    All other components within  normal limits  URINALYSIS, MICROSCOPIC (REFLEX) - Abnormal; Notable for the following components:   Bacteria, UA MANY (*)    All other components within normal limits  PROTIME-INR    EKG None  Radiology No results found.  Procedures Procedures (including critical care time)  Medications Ordered in ED Medications  cefTRIAXone (ROCEPHIN) 1 g in sodium chloride 0.9 % 100 mL IVPB (has no administration in time range)     Initial Impression / Assessment and Plan / ED Course  I have reviewed the triage vital signs and the nursing notes.  Pertinent labs & imaging results that were available during my care of the patient were reviewed by me and considered in my medical decision making (see chart for details).     MDM  Pt has decreased platelets of 57.  Bun and creatine are elevated.   UA shows greater than 50 wbc's.    Pt given Iv fluids, Rocephin.   Pt discussed with Dr. Rex Kras.   Pt would prefer admission at [facility] to hospitalist at [facility] who agrees to accept pt   Final Clinical Impressions(s) / ED Diagnoses   Final diagnoses:  Vasculitis ([place])  Urinary tract infection without hematuria, site unspecified  AKI (acute kidney injury) ([place])  Thrombocytopenia [facility])    ED Discharge Orders    None       [name] 01/18/18 2154    Fransico Meadow, PA-C 01/19/18 0018    Little, Wenda Overland, MD 01/19/18 [phone]

## 2018-01-19 ENCOUNTER — Telehealth (HOSPITAL_BASED_OUTPATIENT_CLINIC_OR_DEPARTMENT_OTHER): Payer: Self-pay | Admitting: Emergency Medicine

## 2019-04-19 IMAGING — DX DG CHEST 2V
2 series · 2 of 2 positions shown · non-contrast
Comparison: 06/26/2016

CLINICAL DATA: Shortness of breath

EXAM:
CHEST - 2 VIEW

[chest pa]
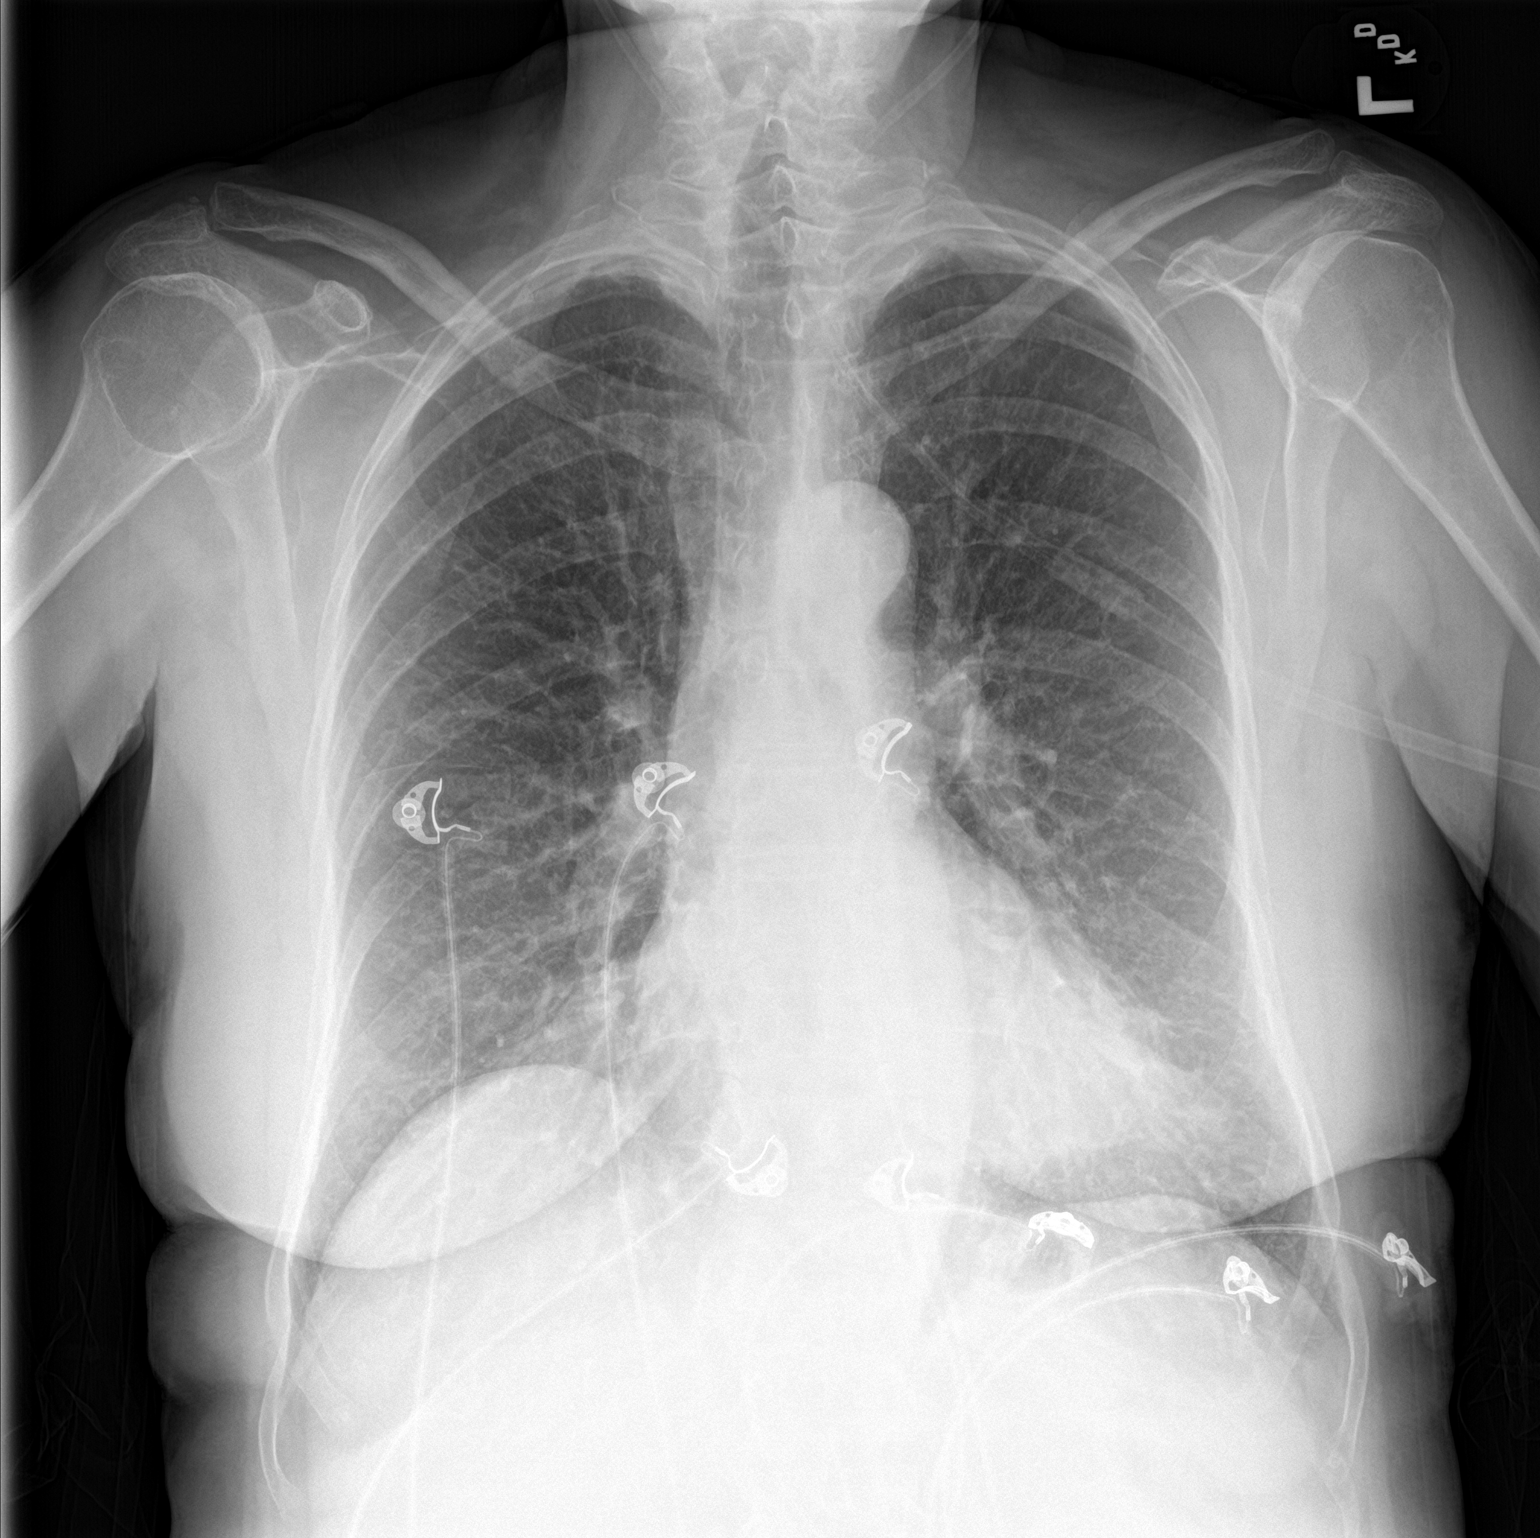

[chest lat]
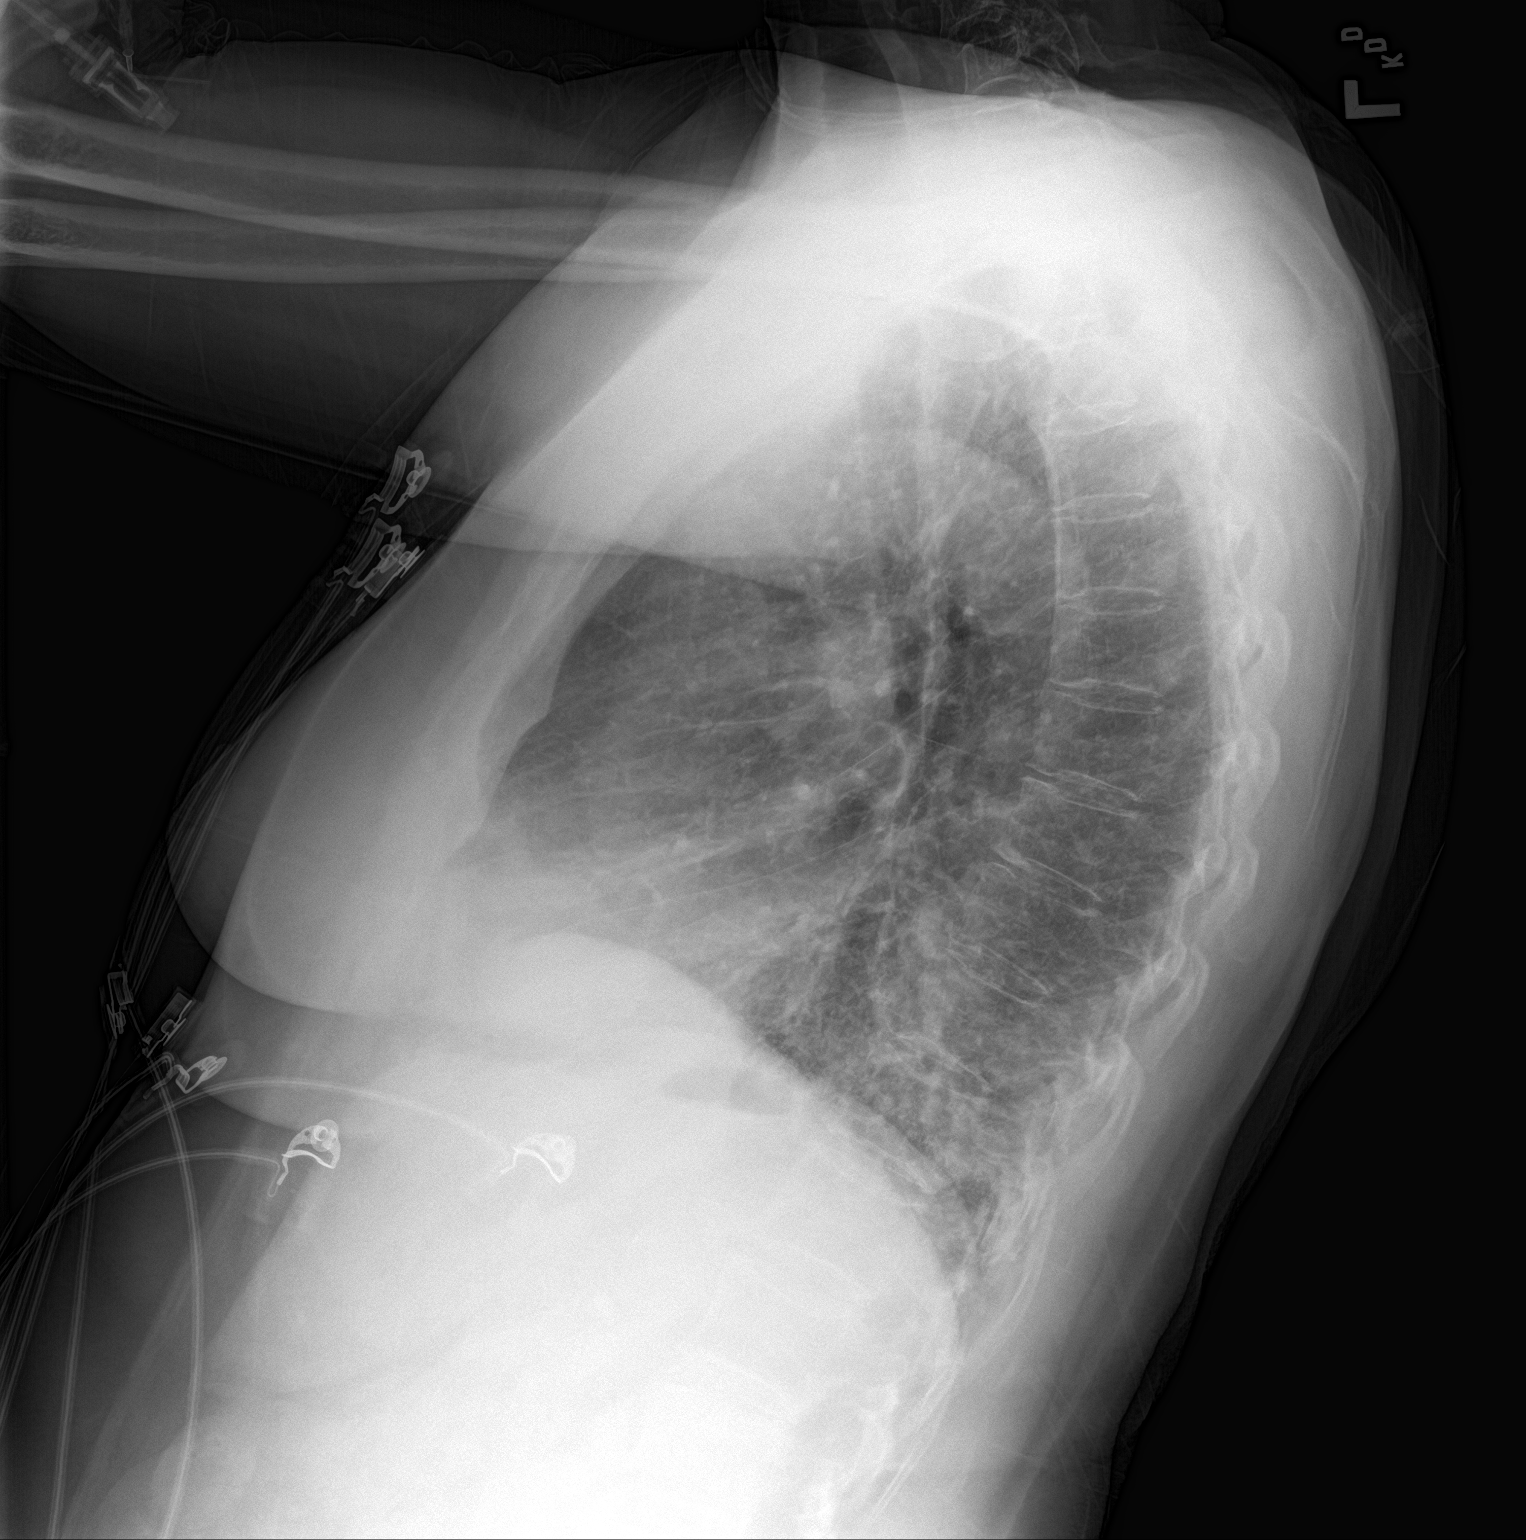

[2 of 2 positions shown; findings below may reference images not displayed]

FINDINGS: Bibasilar atelectasis. Heart is normal size. No effusions or acute
bony abnormality.
IMPRESSION: Bibasilar atelectasis.

## 2020-07-05 ENCOUNTER — Ambulatory Visit (INDEPENDENT_AMBULATORY_CARE_PROVIDER_SITE_OTHER): Payer: Medicare Other

## 2020-07-05 ENCOUNTER — Encounter: Payer: Self-pay | Admitting: Physician Assistant

## 2020-07-05 ENCOUNTER — Ambulatory Visit (INDEPENDENT_AMBULATORY_CARE_PROVIDER_SITE_OTHER): Payer: Medicare Other | Admitting: Physician Assistant

## 2020-07-05 ENCOUNTER — Other Ambulatory Visit: Payer: Self-pay

## 2020-07-05 DIAGNOSIS — M25552 Pain in left hip: Secondary | ICD-10-CM | POA: Diagnosis not present

## 2020-07-05 DIAGNOSIS — G8929 Other chronic pain: Secondary | ICD-10-CM

## 2020-07-05 DIAGNOSIS — M7062 Trochanteric bursitis, left hip: Secondary | ICD-10-CM

## 2020-07-05 DIAGNOSIS — M5441 Lumbago with sciatica, right side: Secondary | ICD-10-CM

## 2020-07-05 MED ORDER — METHYLPREDNISOLONE ACETATE 40 MG/ML IJ SUSP
40.0000 mg | INTRAMUSCULAR | Status: AC | PRN
  Administered 2020-07-05: 40 mg via INTRA_ARTICULAR

## 2020-07-05 MED ORDER — LIDOCAINE HCL 1 % IJ SOLN
3.0000 mL | INTRAMUSCULAR | Status: AC | PRN
  Administered 2020-07-05: 3 mL

## 2020-07-05 NOTE — Progress Notes (Signed)
Office Visit Note   Patient: Kristen Allen           Date of Birth: 05/19/1931           MRN: [id number] Visit Date: 07/05/2020              Requested by: Kristen Allen., MD [address] [place],  [place] [address] PCP: Kristen Allen., MD   Assessment & Plan: Visit Diagnoses:  1. Chronic bilateral low back pain with right-sided sciatica   2. Pain in left hip     Plan: She shown IT band stretching exercises.  She will follow up with [place] as needed.  Patient tolerated injection well today.  Follow-Up Instructions: Return if symptoms worsen or fail to improve.   Orders:  Orders Placed This Encounter  Procedures  . Large Joint Inj: L greater trochanter  . XR Lumbar Spine 2-3 Views  . XR HIP UNILAT W OR W/O PELVIS 2-3 VIEWS LEFT   No orders of the defined types were placed in this encounter.     Procedures: Large Joint Inj: L greater trochanter on 07/05/2020 8:46 AM Indications: pain Details: 22 G 1.5 in needle, lateral approach  Arthrogram: No  Medications: 3 mL lidocaine 1 %; 40 mg methylPREDNISolone acetate 40 MG/ML Outcome: tolerated well, no immediate complications Procedure, treatment alternatives, risks and benefits explained, specific risks discussed. Consent was given by the patient. Immediately prior to procedure a time out was called to verify the correct patient, procedure, equipment, support staff and site/side marked as required. Patient was prepped and draped in the usual sterile fashion.       Clinical Data: No additional findings.   Subjective: Chief Complaint  Patient presents with  . Left Hip - Pain  . Lower Back - Pain    HPI Kristen Allen so I have not seen in some time.  She comes in today with left hip pain.  She states in the past she has seen Dr. Ninfa Linden for left lateral hip pain and he is given an injection.  She states 1 injection helped the other one bit and she does find that walking helps with the left hip pain.  She is  unable to lie on the left hip at night due to pain.  She denies any radicular symptoms down the leg.  Pain is been ongoing for the past 3 to 4 months.  Does awaken her periodically.  She has no groin pain.  Notes low back pain with cleaning.  Some pain lateral aspect of her right hip. Review of Systems No fevers or chills.  Please see HPI otherwise negative noncontributory  Objective: Vital Signs: There were no vitals taken for this visit.  Physical Exam Constitutional:      Appearance: She is not ill-appearing or diaphoretic.  Pulmonary:     Effort: Pulmonary effort is normal.  Neurological:     Mental Status: She is alert and oriented to person, place, and time.  Psychiatric:        Mood and Affect: Mood normal.     Ortho Exam Lower extremity she has 5 and a 5 strength throughout the lower extremities against resistance.  Good range of motion bilateral hips with discomfort with external rotation of both hips.  Tenderness over the trochanteric region of both hips left greater than right. Specialty Comments:  No specialty comments available.  Imaging: XR HIP UNILAT W OR W/O PELVIS 2-3 VIEWS LEFT  Result Date: 07/05/2020 AP pelvis  lateral view of the left hip: No acute fractures.  Both hips well located.  Hip joints overall well-maintained.  No bony abnormalities otherwise.  XR Lumbar Spine 2-3 Views  Result Date: 07/05/2020 Lumbar spine 2 views: No acute fracture.  Normal lordotic curvature.  Slight scoliosis.  Facet degenerative changes lower lumbar spine.  Arthrosclerosis of aorta noted.  No spinal listhesis.    PMFS History: There are no problems to display for this patient.  Past Medical History:  Diagnosis Date  . Abnormal mammogram   . Arthritis 1965  . Barrett esophagus 2009  . Breast lump in female   . Cancer ([place]) 2012   squamous cell lower left breast  . Cough   . Diverticulosis 2008  . Esophageal reflux   . Hypertension 2012    No family history on file.   Past Surgical History:  Procedure Laterality Date  . ABDOMINAL HYSTERECTOMY    . APPENDECTOMY    . COLON SURGERY     Social History   Occupational History  . Not on file  Tobacco Use  . Smoking status: Former Smoker    Packs/day: 1.00    Years: 30.00    Pack years: 30.00    Types: Cigarettes  . Smokeless tobacco: Never Used  Substance and Sexual Activity  . Alcohol use: No  . Drug use: No  . Sexual activity: Not on file

## 2022-08-18 ENCOUNTER — Encounter (HOSPITAL_BASED_OUTPATIENT_CLINIC_OR_DEPARTMENT_OTHER): Payer: Self-pay

## 2022-08-18 ENCOUNTER — Emergency Department (HOSPITAL_BASED_OUTPATIENT_CLINIC_OR_DEPARTMENT_OTHER): Payer: Medicare Other

## 2022-08-18 ENCOUNTER — Emergency Department (HOSPITAL_BASED_OUTPATIENT_CLINIC_OR_DEPARTMENT_OTHER)
Admission: EM | Admit: 2022-08-18 | Discharge: 2022-08-18 | Disposition: A | Discharge status: Home / Self Care | Payer: Medicare Other | Attending: Emergency Medicine | Admitting: Emergency Medicine

## 2022-08-18 ENCOUNTER — Other Ambulatory Visit: Payer: Self-pay

## 2022-08-18 DIAGNOSIS — D649 Anemia, unspecified: Secondary | ICD-10-CM | POA: Diagnosis not present

## 2022-08-18 DIAGNOSIS — R1032 Left lower quadrant pain: Secondary | ICD-10-CM | POA: Insufficient documentation

## 2022-08-18 DIAGNOSIS — E878 Other disorders of electrolyte and fluid balance, not elsewhere classified: Secondary | ICD-10-CM | POA: Insufficient documentation

## 2022-08-18 DIAGNOSIS — E871 Hypo-osmolality and hyponatremia: Secondary | ICD-10-CM | POA: Diagnosis not present

## 2022-08-18 DIAGNOSIS — I493 Ventricular premature depolarization: Secondary | ICD-10-CM | POA: Diagnosis not present

## 2022-08-18 DIAGNOSIS — E876 Hypokalemia: Secondary | ICD-10-CM | POA: Insufficient documentation

## 2022-08-18 DIAGNOSIS — I10 Essential (primary) hypertension: Secondary | ICD-10-CM | POA: Diagnosis not present

## 2022-08-18 DIAGNOSIS — Z79899 Other long term (current) drug therapy: Secondary | ICD-10-CM | POA: Insufficient documentation

## 2022-08-18 LAB — CBC WITH DIFFERENTIAL/PLATELET
Abs Immature Granulocytes: 0.02 10*3/uL (ref 0.00–0.07)
Basophils Absolute: 0 10*3/uL (ref 0.0–0.1)
Basophils Relative: 1 %
Eosinophils Absolute: 0.2 10*3/uL (ref 0.0–0.5)
Eosinophils Relative: 3 %
HCT: 34.8 % — ABNORMAL LOW (ref 36.0–46.0)
Hemoglobin: 11.8 g/dL — ABNORMAL LOW (ref 12.0–15.0)
Immature Granulocytes: 0 %
Lymphocytes Relative: 32 %
Lymphs Abs: 2 10*3/uL (ref 0.7–4.0)
MCH: 31.1 pg (ref 26.0–34.0)
MCHC: 33.9 g/dL (ref 30.0–36.0)
MCV: 91.6 fL (ref 80.0–100.0)
Monocytes Absolute: 0.6 10*3/uL (ref 0.1–1.0)
Monocytes Relative: 10 %
Neutro Abs: 3.5 10*3/uL (ref 1.7–7.7)
Neutrophils Relative %: 54 %
Platelets: 256 10*3/uL (ref 150–400)
RBC: 3.8 MIL/uL — ABNORMAL LOW (ref 3.87–5.11)
RDW: 13 % (ref 11.5–15.5)
WBC: 6.4 10*3/uL (ref 4.0–10.5)
nRBC: 0 % (ref 0.0–0.2)

## 2022-08-18 LAB — URINALYSIS, ROUTINE W REFLEX MICROSCOPIC
Bilirubin Urine: NEGATIVE
Glucose, UA: NEGATIVE mg/dL
Hgb urine dipstick: NEGATIVE
Ketones, ur: NEGATIVE mg/dL
Leukocytes,Ua: NEGATIVE
Nitrite: NEGATIVE
Protein, ur: NEGATIVE mg/dL
Specific Gravity, Urine: 1.01 (ref 1.005–1.030)
pH: 7 (ref 5.0–8.0)

## 2022-08-18 LAB — COMPREHENSIVE METABOLIC PANEL
ALT: 17 U/L (ref 0–44)
AST: 20 U/L (ref 15–41)
Albumin: 4 g/dL (ref 3.5–5.0)
Alkaline Phosphatase: 47 U/L (ref 38–126)
Anion gap: 8 (ref 5–15)
BUN: 15 mg/dL (ref 8–23)
CO2: 26 mmol/L (ref 22–32)
Calcium: 9.4 mg/dL (ref 8.9–10.3)
Chloride: 94 mmol/L — ABNORMAL LOW (ref 98–111)
Creatinine, Ser: 0.8 mg/dL (ref 0.44–1.00)
GFR, Estimated: >60 mL/min (ref >60)
Glucose, Bld: 98 mg/dL (ref 70–99)
Potassium: 3.4 mmol/L — ABNORMAL LOW (ref 3.5–5.1)
Sodium: 128 mmol/L — ABNORMAL LOW (ref 135–145)
Total Bilirubin: 0.6 mg/dL (ref 0.3–1.2)
Total Protein: 6.9 g/dL (ref 6.5–8.1)

## 2022-08-18 LAB — LIPASE, BLOOD: Lipase: 40 U/L (ref 11–51)

## 2022-08-18 MED ORDER — METOPROLOL SUCCINATE ER 25 MG PO TB24
25.0000 mg | ORAL_TABLET | Freq: Every day | ORAL | Status: DC
  Administered 2022-08-18: 25 mg via ORAL
  Filled 2022-08-18: qty 1

## 2022-08-18 MED ORDER — MORPHINE SULFATE (PF) 4 MG/ML IV SOLN
4.0000 mg | Freq: Once | INTRAVENOUS | Status: AC
  Administered 2022-08-18: 4 mg via INTRAVENOUS
  Filled 2022-08-18: qty 1

## 2022-08-18 MED ORDER — IBUPROFEN 400 MG PO TABS
400.0000 mg | ORAL_TABLET | Freq: Once | ORAL | Status: AC
  Administered 2022-08-18: 400 mg via ORAL
  Filled 2022-08-18: qty 1

## 2022-08-18 MED ORDER — ONDANSETRON 4 MG PO TBDP: 4.0000 mg | ORAL_TABLET | Freq: Three times a day (TID) | ORAL | 0 refills | Status: DC | PRN

## 2022-08-18 MED ORDER — ONDANSETRON HCL 4 MG/2ML IJ SOLN
4.0000 mg | Freq: Once | INTRAMUSCULAR | Status: AC
  Administered 2022-08-18: 4 mg via INTRAVENOUS
  Filled 2022-08-18: qty 2

## 2022-08-18 MED ORDER — METAMUCIL SMOOTH TEXTURE 58.6 % PO POWD: 1.0000 | Freq: Every day | ORAL | 1 refills | Status: DC

## 2022-08-18 MED ORDER — IOHEXOL 300 MG/ML  SOLN
100.0000 mL | Freq: Once | INTRAMUSCULAR | Status: AC | PRN
  Administered 2022-08-18: 100 mL via INTRAVENOUS

## 2022-08-18 MED ORDER — LACTATED RINGERS IV BOLUS
1000.0000 mL | Freq: Once | INTRAVENOUS | Status: AC
  Administered 2022-08-18: 1000 mL via INTRAVENOUS

## 2022-08-18 MED ORDER — HYDROCHLOROTHIAZIDE 25 MG PO TABS
12.5000 mg | ORAL_TABLET | Freq: Once | ORAL | Status: AC
  Administered 2022-08-18: 12.5 mg via ORAL
  Filled 2022-08-18: qty 1

## 2022-08-18 NOTE — ED Triage Notes (Signed)
Pt reports x1 week of lower abd pain. Pt reports she had a BM yesterday. States she took Miralax and she had a normal BM. Pt denies fevers at home. Pt endorses a little nausea yesterday, felt better after she had BM. Pt with hx of colon cancer. No hx of SBO.

## 2022-08-18 NOTE — Discharge Instructions (Signed)
  You have been seen in the Emergency Department (ED) for abdominal pain. Your workup was generally reassuring; however, it did not identify a clear cause of your symptoms.  Start taking Metamucil daily as prescribed.  Make sure to drink plenty of fluids.  You can take the Zofran as needed for nausea or vomiting.  Can take Tylenol and ibuprofen as needed for abdominal pain.  Please follow up with your primary care doctor as soon as possible regarding today's ED visit and the symptoms that are bothering.  Return to the ED if your abdominal pain worsens or fails to improve, you develop bloody vomiting, bloody diarrhea, you are unable to tolerate fluids due to vomiting, fever greater than 101, or any other concerning symptoms.

## 2022-08-18 NOTE — ED Provider Notes (Signed)
[facility] EMERGENCY DEPARTMENT AT [facility] Provider Note   CSN: [id number] Arrival date & time: 08/18/22  0351     History  Chief Complaint  Patient presents with   Abdominal Pain    Kristen Allen is a 89 y.o. female.  With PMH of GERD, HTN, diverticulosis, hiatal hernia, history of hemicolectomy, appendectomy, hysterectomy who presents with abdominal pain and constipation.  Patient complaining of left lower quadrant pain constant nature over the past 4 days.  She has small hard bowel movements but had a normal bowel movement yesterday with MiraLAX.  She is still passing gas.  She had episodes of nausea but no vomiting.  No fevers appreciated at home.  She has straining with urination due to previous surgeries but no pain with urination or hematuria.  Every time she strains she does have a bowel movement.  When she came in she said she took ibuprofen last night which let her sleep but then woke up at 2 AM with pain.  She has not taken any of her blood pressure medications today.  She denies any chest pain or shortness of breath.  HPI     Home Medications Prior to Admission medications   Medication Sig Start Date End Date Taking? Authorizing Provider  ondansetron (ZOFRAN-ODT) 4 MG disintegrating tablet Take 1 tablet (4 mg total) by mouth every 8 (eight) hours as needed. 08/18/22  Yes Mardene Sayer, MD  psyllium (METAMUCIL SMOOTH TEXTURE) 58.6 % powder Take 1 packet by mouth daily. 08/18/22  Yes Mardene Sayer, MD  5-Hydroxytryptophan 100 MG CAPS Take by mouth.    [provider]  Cholecalciferol 50 MCG (2000 UT) CAPS Take by mouth.    [provider]  diphenhydrAMINE (BENADRYL) 25 mg capsule Take by mouth.    [provider]  hydrochlorothiazide (HYDRODIURIL) 12.5 MG tablet  01/18/16   [provider]  loratadine (CLARITIN) 10 MG tablet Take by mouth.    [provider]  metoprolol succinate (TOPROL-XL) 25 MG 24 hr  tablet Take 25 mg by mouth 2 (two) times daily.    [provider]      Allergies    Codeine and Erythromycin    Review of Systems   Review of Systems  Physical Exam Updated Vital Signs BP (!) 158/60 (BP Location: Right Arm)   Pulse (!) 107   Temp 98 F (36.7 C) (Oral)   Resp 18   Ht 5\' 6"  (1.676 m)   Wt 62.6 kg   SpO2 97%   BMI 22.27 kg/m  Physical Exam Constitutional: Alert and oriented. Well appearing and in no distress. Eyes: Conjunctivae are normal. ENT      Head: Normocephalic and atraumatic. Cardiovascular: S1, S2,  Normal and symmetric distal pulses are present in all extremities.Warm and well perfused. Respiratory: Normal respiratory effort. Breath sounds are normal.  O2 sat 100 on RA Gastrointestinal: Soft and nondistended with left lower quadrant tenderness to palpation no rebound or guarding.  No CVA tenderness.  Abdomen not peritonitic. Musculoskeletal: Normal range of motion in all extremities. Neurologic: Normal speech and language.  No facial droop.  Moving all extremities equally.  Sensation grossly intact.  No gross focal neurologic deficits are appreciated. Skin: Skin is warm, dry and intact. No rash noted. Psychiatric: Mood and affect are normal. Speech and behavior are normal.  ED Results / Procedures / Treatments   Labs (all labs ordered are listed, but only abnormal results are displayed) Labs Reviewed  COMPREHENSIVE METABOLIC PANEL - Abnormal; Notable for the following components:      Result Value   Sodium 128 (*)    Potassium 3.4 (*)    Chloride 94 (*)    All other components within normal limits  CBC WITH DIFFERENTIAL/PLATELET - Abnormal; Notable for the following components:   RBC 3.80 (*)    Hemoglobin 11.8 (*)    HCT 34.8 (*)    All other components within normal limits  LIPASE, BLOOD  URINALYSIS, ROUTINE W REFLEX MICROSCOPIC    EKG EKG Interpretation  Date/Time:  Friday August 18 2022 05:04:30 EDT Ventricular Rate:   65 PR Interval:  215 QRS Duration: 99 QT Interval:  454 QTC Calculation: 389 R Axis:   63 Text Interpretation: Sinus rhythm Supraventricular bigeminy Borderline prolonged PR interval Low voltage, extremity and precordial leads new pvcs Confirmed by Vivien Rossetti ([id number]) on 08/18/2022 5:08:21 AM  Radiology CT ABDOMEN PELVIS W CONTRAST  Result Date: 08/18/2022 CLINICAL DATA:  Left lower quadrant pain and constipation. History of colon cancer. Hysterectomy and appendectomy. EXAM: CT ABDOMEN AND PELVIS WITH CONTRAST TECHNIQUE: Multidetector CT imaging of the abdomen and pelvis was performed using the standard protocol following bolus administration of intravenous contrast. RADIATION DOSE REDUCTION: This exam was performed according to the departmental dose-optimization program which includes automated exposure control, adjustment of the mA and/or kV according to patient size and/or use of iterative reconstruction technique. CONTRAST:  OMNIPAQUE IOHEXOL 300 MG/ML  SOLN COMPARISON:  01/16/2018 FINDINGS: Lower chest: Small to moderate hiatal hernia. Hepatobiliary: No suspicious focal abnormality within the liver parenchyma. There is no evidence for gallstones, gallbladder wall thickening, or pericholecystic fluid. No intrahepatic or extrahepatic biliary dilation. Pancreas: No focal mass lesion. No dilatation of the main duct. No intraparenchymal cyst. No peripancreatic edema. Spleen: No splenomegaly. No focal mass lesion. Adrenals/Urinary Tract: No adrenal nodule or mass. Kidneys unremarkable. No evidence for hydroureter. The urinary bladder appears normal for the degree of distention. Stomach/Bowel: Small to moderate hiatal hernia. Stomach otherwise unremarkable. Duodenum is normally positioned as is the ligament of Treitz. No small bowel wall thickening. No small bowel dilatation. Right hemicolectomy. No gross colonic mass. No colonic wall thickening. No substantial diverticular disease in the left  colon. No findings to suggest diverticulitis. Vascular/Lymphatic: There is mild atherosclerotic calcification of the abdominal aorta without aneurysm. There is no gastrohepatic or hepatoduodenal ligament lymphadenopathy. No retroperitoneal or mesenteric lymphadenopathy. No pelvic sidewall lymphadenopathy. Reproductive: Hysterectomy.  There is no adnexal mass. Other: No intraperitoneal free fluid. Musculoskeletal: No worrisome lytic or sclerotic osseous abnormality. IMPRESSION: 1. No acute findings in the abdomen or pelvis. Specifically, no findings to explain the patient's history of left lower quadrant pain. 2. Small to moderate hiatal hernia. 3. Status post right hemicolectomy. 4.  Aortic Atherosclerosis (ICD10-I70.0). Electronically Signed   By: Kennith Center M.D.   On: 08/18/2022 06:04    Procedures Procedures    Medications Ordered in ED Medications  metoprolol succinate (TOPROL-XL) 24 hr tablet 25 mg (25 mg Oral Given 08/18/22 0505)  ibuprofen (ADVIL) tablet 400 mg (has no administration in time range)  morphine (PF) 4 MG/ML injection 4 mg (4 mg Intravenous Given 08/18/22 0448)  ondansetron (ZOFRAN) injection 4 mg (4 mg Intravenous Given 08/18/22 0448)  lactated ringers bolus 1,000 mL (1,000 mLs Intravenous New Bag/Given 08/18/22 0446)  hydrochlorothiazide (HYDRODIURIL) tablet 12.5 mg (12.5 mg Oral Given 08/18/22 0505)  iohexol (OMNIPAQUE) 300 MG/ML solution 100 mL (100 mLs Intravenous Contrast Given 08/18/22 0544)  ED Course/ Medical Decision Making/ A&P                             Medical Decision Making LEILANNI HALVORSON is a 89 y.o. female.  With PMH of GERD, HTN, diverticulosis, hiatal hernia, history of hemicolectomy, appendectomy, hysterectomy who presents with abdominal pain and constipation.  Of note on patient's presentation she presented hypertensive 173/123 however asymptomatic for hypertension.  Without any chest pain, shortness of breath, no headache, no stroke symptoms.  No  findings suggestive of hypertensive emergency.  No pulse or neurologic deficits concerning for dissection.  She did not take any of her home antihypertensives today and I suspect hypertension also related to pain and sympathetic response.  Based on patient's history and presentation, consider bowel obstruction, ileus, diverticulitis, colitis, severe constipation, UTI among multiple other etiologies that could contribute to left lower quadrant abdominal pain.  Labs reviewed by me notable for mild hyponatremia 128 mild hypochloremia 94.  Mild hypokalemia 3.4.  All likely related to decreased p.o. intake and hypovolemia changes.  Creatinine is 0.8 within normal limits, no AKI present.  White blood cell count 6.4 within normal limits with no left shift present.  Mild normocytic anemia hemoglobin 11.8.  CTAP with contrast obtained which I personally reviewed no evidence of bowel obstruction.  No evidence of ileus.  No significant constipation, no diverticulitis, no acute findings to explain patient's pain.  UA without evidence of UTI.  Patient tolerating p.o. in ED.  No severe pain.  Unclear etiology of pain which I discussed with patient and patient's family member at bedside.  Advise starting Metamucil with p.o. hydration and send prescription for as needed Zofran.  Discharge precautions discussed.  Advise close follow-up with PCP regarding visit to the ER today for further evaluation and workup.  Amount and/or Complexity of Data Reviewed Labs: ordered. Radiology: ordered.  Risk OTC drugs. Prescription drug management.      Final Clinical Impression(s) / ED Diagnoses Final diagnoses:  Left lower quadrant abdominal pain  PVC (premature ventricular contraction)    Rx / DC Orders ED Discharge Orders          Ordered    psyllium (METAMUCIL SMOOTH TEXTURE) 58.6 % powder  Daily        08/18/22 0622    ondansetron (ZOFRAN-ODT) 4 MG disintegrating tablet  Every 8 hours PRN        08/18/22  0622              Mardene Sayer, MD 08/18/22 [phone]

## 2023-11-23 ENCOUNTER — Emergency Department (HOSPITAL_BASED_OUTPATIENT_CLINIC_OR_DEPARTMENT_OTHER)
Admission: EM | Admit: 2023-11-23 | Discharge: 2023-11-23 | Disposition: A | Discharge status: Home / Self Care | Attending: Emergency Medicine | Admitting: Emergency Medicine

## 2023-11-23 ENCOUNTER — Encounter (HOSPITAL_BASED_OUTPATIENT_CLINIC_OR_DEPARTMENT_OTHER): Payer: Self-pay | Admitting: Emergency Medicine

## 2023-11-23 ENCOUNTER — Emergency Department (HOSPITAL_BASED_OUTPATIENT_CLINIC_OR_DEPARTMENT_OTHER)

## 2023-11-23 ENCOUNTER — Other Ambulatory Visit: Payer: Self-pay

## 2023-11-23 ENCOUNTER — Other Ambulatory Visit (HOSPITAL_BASED_OUTPATIENT_CLINIC_OR_DEPARTMENT_OTHER): Payer: Self-pay

## 2023-11-23 DIAGNOSIS — Z79899 Other long term (current) drug therapy: Secondary | ICD-10-CM | POA: Diagnosis not present

## 2023-11-23 DIAGNOSIS — S6991XA Unspecified injury of right wrist, hand and finger(s), initial encounter: Secondary | ICD-10-CM | POA: Diagnosis present

## 2023-11-23 DIAGNOSIS — Z853 Personal history of malignant neoplasm of breast: Secondary | ICD-10-CM | POA: Insufficient documentation

## 2023-11-23 DIAGNOSIS — S52501A Unspecified fracture of the lower end of right radius, initial encounter for closed fracture: Secondary | ICD-10-CM | POA: Diagnosis not present

## 2023-11-23 DIAGNOSIS — S62101A Fracture of unspecified carpal bone, right wrist, initial encounter for closed fracture: Secondary | ICD-10-CM

## 2023-11-23 DIAGNOSIS — W19XXXA Unspecified fall, initial encounter: Secondary | ICD-10-CM | POA: Diagnosis not present

## 2023-11-23 DIAGNOSIS — I10 Essential (primary) hypertension: Secondary | ICD-10-CM | POA: Diagnosis not present

## 2023-11-23 MED ORDER — ONDANSETRON HCL 4 MG/2ML IJ SOLN
INTRAMUSCULAR | Status: AC
  Filled 2023-11-23: qty 2

## 2023-11-23 MED ORDER — FENTANYL CITRATE PF 50 MCG/ML IJ SOSY
50.0000 ug | PREFILLED_SYRINGE | Freq: Once | INTRAMUSCULAR | Status: AC
  Administered 2023-11-23: 50 ug via INTRAVENOUS
  Filled 2023-11-23: qty 1

## 2023-11-23 MED ORDER — ONDANSETRON 4 MG PO TBDP
4.0000 mg | ORAL_TABLET | Freq: Three times a day (TID) | ORAL | 0 refills | Status: DC | PRN
  Filled 2023-11-23: qty 20, 7d supply, fill #0

## 2023-11-23 MED ORDER — TRAMADOL HCL 50 MG PO TABS
50.0000 mg | ORAL_TABLET | Freq: Four times a day (QID) | ORAL | 0 refills | Status: DC | PRN
  Filled 2023-11-23: qty 15, 4d supply, fill #0

## 2023-11-23 MED ORDER — ONDANSETRON HCL 4 MG/2ML IJ SOLN
4.0000 mg | Freq: Once | INTRAMUSCULAR | Status: AC
  Administered 2023-11-23: 4 mg via INTRAVENOUS

## 2023-11-23 MED ORDER — LIDOCAINE HCL (PF) 1 % IJ SOLN
10.0000 mL | Freq: Once | INTRAMUSCULAR | Status: AC
  Administered 2023-11-23: 10 mL via INTRADERMAL
  Filled 2023-11-23: qty 10

## 2023-11-23 NOTE — ED Triage Notes (Signed)
 Lost balance after changing a dressing to RLL. No LOC, landed on right wrist. Obvious deformity noted to wrist

## 2023-11-23 NOTE — Discharge Instructions (Signed)
 Your history, exam, and evaluation today are consistent with a wrist fracture of the radius that was comminuted and impacted.  I spoke with the hand surgery team who recommended reduction attempt with a hematoma block which we did here in the emergency department.  We were able to straighten out your wrist to length and placed a splint.  You tolerated this well.  We feel you are safe for discharge home to follow-up with outpatient hand surgery with Dr. Lorretta in the next week.  Please call to schedule an appointment.  Please use the pain medicine.  Please rest and stay hydrated and you may keep it elevated to help with swelling.  If any symptoms change or worsen acutely, please return to the nearest emergency department.

## 2023-11-23 NOTE — ED Provider Notes (Signed)
 [facility] [place] Provider Note   CSN: [id number] Arrival date & time: 11/23/23  9241     Patient presents with: No chief complaint on file.   Kristen Allen is a 89 y.o. female.   The history is provided by the patient and medical records. No language interpreter was used.  Wrist Pain This is a new problem. The current episode started less than 1 hour ago. The problem occurs constantly. The problem has not changed since onset.Pertinent negatives include no chest pain, no abdominal pain, no headaches and no shortness of breath. Exacerbated by: moving. Nothing relieves the symptoms. She has tried nothing for the symptoms. The treatment provided no relief.       Prior to Admission medications   Medication Sig Start Date End Date Taking? Authorizing Provider  5-Hydroxytryptophan 100 MG CAPS Take by mouth.    [provider]  Cholecalciferol 50 MCG (2000 UT) CAPS Take by mouth.    [provider]  diphenhydrAMINE (BENADRYL) 25 mg capsule Take by mouth.    [provider]  hydrochlorothiazide  (HYDRODIURIL ) 12.5 MG tablet  01/18/16   [provider]  loratadine (CLARITIN) 10 MG tablet Take by mouth.    [provider]  metoprolol  succinate (TOPROL -XL) 25 MG 24 hr tablet Take 25 mg by mouth 2 (two) times daily.    [provider]  ondansetron  (ZOFRAN -ODT) 4 MG disintegrating tablet Take 1 tablet (4 mg total) by mouth every 8 (eight) hours as needed. 08/18/22   Branham, Victoria C, MD  psyllium (METAMUCIL SMOOTH TEXTURE) 58.6 % powder Take 1 packet by mouth daily. 08/18/22   [name], MD    Allergies: Codeine and Erythromycin    Review of Systems  Constitutional:  Negative for chills, fatigue and fever.  HENT:  Negative for congestion.   Respiratory:  Negative for cough, chest tightness and shortness of breath.   Cardiovascular:  Negative for chest pain.  Gastrointestinal:  Negative  for abdominal pain, constipation, diarrhea, nausea and vomiting.  Genitourinary:  Negative for dysuria.  Musculoskeletal:  Negative for back pain.  Skin:  Negative for wound.  Neurological:  Negative for weakness, numbness and headaches.  Psychiatric/Behavioral:  Negative for agitation.   All other systems reviewed and are negative.   Updated Vital Signs There were no vitals taken for this visit.  Physical Exam Vitals and nursing note reviewed.  Constitutional:      General: She is not in acute distress.    Appearance: She is well-developed. She is not ill-appearing, toxic-appearing or diaphoretic.  HENT:     Head: Normocephalic and atraumatic.     Mouth/Throat:     Mouth: Mucous membranes are moist.  Eyes:     Conjunctiva/sclera: Conjunctivae normal.  Cardiovascular:     Rate and Rhythm: Normal rate and regular rhythm.     Heart sounds: No murmur heard. Pulmonary:     Effort: Pulmonary effort is normal. No respiratory distress.     Breath sounds: Normal breath sounds. No wheezing, rhonchi or rales.  Chest:     Chest wall: No tenderness.  Abdominal:     Palpations: Abdomen is soft.     Tenderness: There is no abdominal tenderness.  Musculoskeletal:        General: Swelling, tenderness, deformity and signs of injury present.     Right wrist: Swelling, deformity and tenderness present. No lacerations. Normal pulse.     Cervical back: Neck supple. No tenderness.  Comments: Bruising and deformity to right wrist.  Intact pulse, sensation, and strength of the fingers.  No tenderness to the elbow or shoulder.  Skin:    General: Skin is warm and dry.     Capillary Refill: Capillary refill takes less than 2 seconds.     Findings: Bruising present. No rash.  Neurological:     General: No focal deficit present.     Mental Status: She is alert.     Sensory: No sensory deficit.     Motor: No weakness.  Psychiatric:        Mood and Affect: Mood normal.     (all labs ordered  are listed, but only abnormal results are displayed) Labs Reviewed - No data to display  EKG: None  Radiology: DG Wrist Complete Right Result Date: 11/23/2023 EXAM: 3 or more VIEW(S) XRAY OF THE RIGHT WRIST 11/23/2023 08:40:00 AM COMPARISON: None available. CLINICAL HISTORY: Fall. Lost balance after changing a dressing to RLL. No LOC, landed on right wrist. Obvious deformity noted to wrist. FINDINGS: BONES AND JOINTS: There is an acute, impacted comminuted intraarticular fracture involving the distal radius with dorsal angulation of the distal fracture fragments. No additional acute bone abnormality. 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 SOFT TISSUES: The soft tissues are unremarkable. IMPRESSION: 1. Acute, impacted comminuted intraarticular fracture involving the distal radius with dorsal angulation of the distal fracture fragments. 2. 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 Electronically signed by: Waddell Calk MD 11/23/2023 08:46 AM EDT RP Workstation: [id number]     .Reduction of fracture  Date/Time: 11/23/2023 11:44 AM  Performed by: Ginger Lonni PARAS, MD Authorized by: Ginger Lonni PARAS, MD  Consent: Written consent obtained Risks and benefits: risks, benefits and alternatives were discussed Consent given by: patient Patient understanding: patient states understanding of the procedure being performed Patient consent: the patient's understanding of the procedure matches consent given Imaging studies: imaging studies available Patient identity confirmed: verbally with patient and arm band Time out: Immediately prior to procedure a time out was called to verify the correct patient, procedure, equipment, support staff and site/side marked as required. Preparation: Patient was prepped and draped in the usual sterile fashion. Local anesthesia used: yes Anesthesia: hematoma block  Anesthesia: Local anesthesia used: yes Local  Anesthetic: lidocaine  1% without epinephrine Anesthetic total: 10 mL  Sedation: Patient sedated: no  Patient tolerance: patient tolerated the procedure well with no immediate complications      Medications Ordered in the ED  fentaNYL (SUBLIMAZE) injection 50 mcg (50 mcg Intravenous Given 11/23/23 0827)  lidocaine  (PF) (XYLOCAINE ) 1 % injection 10 mL (10 mLs Intradermal Given by Other 11/23/23 1009)  fentaNYL (SUBLIMAZE) injection 50 mcg (50 mcg Intravenous Given 11/23/23 1008)  ondansetron  (ZOFRAN ) injection 4 mg (4 mg Intravenous Given 11/23/23 1029)                                    Medical Decision Making Amount and/or Complexity of Data Reviewed Radiology: ordered.  Risk Prescription drug management.    MAKHIA VOSLER is a 89 y.o. female with a past medical history significant for hypertension, diverticulosis, previous breast cancer, previous appendectomy, previous hysterectomy, and GERD who is left-handed who presents with right wrist injury.  According to patient, she was trying to change the dressing on a right lower leg wound from a skin removal procedure when she tripped and fell backwards onto  her outstretched right arm.  She is left-handed.  She felt a crunch and has deformity to her right wrist.  She is worried it is broken.  It was her birthday 2 days ago and she turned 89.  Otherwise patient denies other complaints.  She denied any chest pain, palpitation or shortness of breath prior to the fall and only hit her wrist.  She is not in pain in her hips, back, abdomen, chest, neck, or head.  On exam, patient has deformity to the right wrist with bruising.  She does have intact sensation and can wiggle her fingers.  She has intact pulses.  No laceration seen.  Elbow and shoulder nontender with good range of motion.  Patient otherwise well-appearing.  She has a bandage on her right shin from the recent skin procedure.  Clinically I am concerned about wrist fracture.  Will get  x-rays and give her a dose of pain medicine.  Anticipate she may be candidate for splinting and outpatient follow-up but due to the appearance I anticipate it may need hematoma block and some reduction prior to splinting.  Anticipate reassessment after x-rays obtained.  X-ray revealed fracture.  I called and spoke with Ozell Purchase with hand orthopedics who recommended a reduction attempt with a hematoma block splinting and follow-up with Dr. Lorretta in clinic in the next week.  Patient agreed with plan.  Hematoma block was performed out difficulty and it was reduced as best as possible given the crumbly nature of her fracture.  Her wrist looked much straighter and more at length than before.  It was splinted and she had intact capillary refill, sensation, and strength distally with the splint in place.  She was feeling better.  Will discharge with prescription for some Ultram  to help with pain and prescription for some nausea medicine and she can follow-up with Dr. Laurine.  She agreed with plan of care return precautions and was discharged after fracture management.     Final diagnoses:  Fall, initial encounter  Closed fracture of distal end of right radius, unspecified fracture morphology, initial encounter  Closed fracture of right wrist, initial encounter    ED Discharge Orders          Ordered    ondansetron  (ZOFRAN -ODT) 4 MG disintegrating tablet  Every 8 hours PRN        11/23/23 1142    traMADol  (ULTRAM ) 50 MG tablet  Every 6 hours PRN        11/23/23 1142           Clinical Impression: 1. Fall, initial encounter   2. Closed fracture of distal end of right radius, unspecified fracture morphology, initial encounter   3. Closed fracture of right wrist, initial encounter     Disposition: Discharge  Condition: Good  I have discussed the results, Dx and Tx plan with the pt(& family if present). He/she/they expressed understanding and agree(s) with the plan. Discharge  instructions discussed at great length. Strict return precautions discussed and pt &/or family have verbalized understanding of the instructions. No further questions at time of discharge.    New Prescriptions   ONDANSETRON  (ZOFRAN -ODT) 4 MG DISINTEGRATING TABLET    Take 1 tablet (4 mg total) by mouth every 8 (eight) hours as needed for nausea or vomiting.   TRAMADOL  (ULTRAM ) 50 MG TABLET    Take 1 tablet (50 mg total) by mouth every 6 (six) hours as needed for severe pain (pain score 7-10).    Follow Up:  Pa, [facility] [address] [place] [address] [phone]   with hand surgery  [facility] Emergency Department at [facility] [address] [place]  [address] [phone]        Lovenia Debruler, Lonni PARAS, MD 11/23/23 1144

## 2023-12-13 ENCOUNTER — Encounter (HOSPITAL_BASED_OUTPATIENT_CLINIC_OR_DEPARTMENT_OTHER): Payer: Self-pay | Admitting: Orthopedic Surgery

## 2023-12-13 ENCOUNTER — Other Ambulatory Visit (INDEPENDENT_AMBULATORY_CARE_PROVIDER_SITE_OTHER)

## 2023-12-13 ENCOUNTER — Other Ambulatory Visit: Payer: Self-pay

## 2023-12-13 ENCOUNTER — Ambulatory Visit: Admitting: Orthopedic Surgery

## 2023-12-13 DIAGNOSIS — S52572A Other intraarticular fracture of lower end of left radius, initial encounter for closed fracture: Secondary | ICD-10-CM

## 2023-12-13 DIAGNOSIS — M25531 Pain in right wrist: Secondary | ICD-10-CM | POA: Diagnosis not present

## 2023-12-13 NOTE — H&P (View-Only) (Signed)
 Kristen Allen - 89 y.o. female MRN [id number]  Date of birth: [birth year]  Office Visit Note: Visit Date: 12/13/2023 PCP: Abran Jon CROME, MD Referred by: Abran Jon CROME, MD  Subjective: No chief complaint on file.  HPI: Kristen Allen is a pleasant 89 y.o. female who presents today for evaluation of right wrist injury sustained 3 weeks prior.  Mechanism described as mechanical fall onto outstretched right wrist.  She is overall active at baseline, left handed.  Was seen in ED day of injury and underwent bedside reduction and splinting. Has maintained splint since reduction.   Pertinent ROS were reviewed with the patient and found to be negative unless otherwise specified above in HPI.   Visit Reason: Right distal radius fx  Duration of symptoms: since 11/23/23 Hand dominance: left Occupation: retired Diabetic: No Smoking: No Heart/Lung History: none Blood Thinners: none  Prior Testing/EMG: x-rays Injections (Date): none Treatments: splint  Prior Surgery: none  Assessment & Plan: Visit Diagnoses:  1. Other closed intra-articular fracture of distal end of left radius, initial encounter   2. Pain in right wrist     Plan: Extensive discussion was had with the patient today regarding her right  distal radius fracture.  We discussed the displaced intra-articular nature of injury at the distal radius and the associated comminution.  We discussed treatment modalities ranging from conservative to surgical.  From a conservative standpoint, we discussed ongoing immobilization to allow for ongoing healing in the position it is currently in, however I explained that given the displacement, angulation and shortening of the injury, this would predispose to potential loss of range of motion and function at the wrist level.  From a surgical standpoint, we discussed open reduction internal fixation in order to restore the anatomy, promote healing and early mobilization of the wrist.   The  benefits of this procedure would be to promote fracture healing by providing stability and to heal the fracture in the appropriate alignment. The alternatives of this surgery would be to treat the fracture with immobilization in a splint/brace/cast or to do no intervention. The patient's questions were answered to satisfaction. After this discussion, patient elected to proceed with surgery. Informed consent was obtained.    Risks and benefits of the procedure were discussed, risks including but not limited to infection, bleeding, scarring, stiffness, nerve injury, tendon injury, vascular injury, hardware complication, recurrence of symptoms and need for subsequent operation.  Patient expressed understanding.  We will move forward with surgical scheduling of right wrist distal radius fracture open reduction internal fixation.    Follow-up: No follow-ups on file.   Meds & Orders: No orders of the defined types were placed in this encounter.   Orders Placed This Encounter  Procedures   XR Wrist Complete Right     Procedures: No procedures performed      Clinical History: No specialty comments available.  She reports that she has quit smoking. Her smoking use included cigarettes. She has a 30 pack-year smoking history. She has never used smokeless tobacco. No results for input(s): HGBA1C, LABURIC in the last 8760 hours.  Objective:   Vital Signs: There were no vitals taken for this visit.  Physical Exam  Gen: Well-appearing, in no acute distress; non-toxic CV: Regular Rate. Well-perfused. Warm.  Resp: Breathing unlabored on room air; no wheezing. Psych: Fluid speech in conversation; appropriate affect; normal thought process  Ortho Exam Left wrist - skin intact, minimal swelling - sensation intact m/r/u - AIN/PIN/IO intact -  ROM limited 2/2 pain - notable thumb basilar deformity   Imaging: XR Wrist Complete Right Result Date: 12/13/2023 Xrays demonstrate impacted comminuted  intraarticular fracture involving the distal radius with dorsal angulation of the distal fracture fragments, also with associated degenerative changes at thumb basilar joint    Past Medical/Family/Surgical/Social History: Medications & Allergies reviewed per EMR, new medications updated. There are no active problems to display for this patient.  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 Past Surgical History:  Procedure Laterality Date   ABDOMINAL HYSTERECTOMY     APPENDECTOMY     COLON SURGERY     Social History   Occupational History   Not on file  Tobacco Use   Smoking status: Former    Current packs/day: 1.00    Average packs/day: 1 pack/day for 30.0 years (30.0 ttl pk-yrs)    Types: Cigarettes   Smokeless tobacco: Never  Substance and Sexual Activity   Alcohol use: No   Drug use: No   Sexual activity: Not on file    [name], M.D. [facility], Hand Surgery

## 2023-12-13 NOTE — Progress Notes (Signed)
 Kristen Allen - 89 y.o. female MRN [id number]  Date of birth: [birth year]  Office Visit Note: Visit Date: 12/13/2023 PCP: Abran Jon CROME, MD Referred by: Abran Jon CROME, MD  Subjective: No chief complaint on file.  HPI: Kristen Allen is a pleasant 89 y.o. female who presents today for evaluation of right wrist injury sustained 3 weeks prior.  Mechanism described as mechanical fall onto outstretched right wrist.  She is overall active at baseline, left handed.  Was seen in ED day of injury and underwent bedside reduction and splinting. Has maintained splint since reduction.   Pertinent ROS were reviewed with the patient and found to be negative unless otherwise specified above in HPI.   Visit Reason: Right distal radius fx  Duration of symptoms: since 11/23/23 Hand dominance: left Occupation: retired Diabetic: No Smoking: No Heart/Lung History: none Blood Thinners: none  Prior Testing/EMG: x-rays Injections (Date): none Treatments: splint  Prior Surgery: none  Assessment & Plan: Visit Diagnoses:  1. Other closed intra-articular fracture of distal end of left radius, initial encounter   2. Pain in right wrist     Plan: Extensive discussion was had with the patient today regarding her right  distal radius fracture.  We discussed the displaced intra-articular nature of injury at the distal radius and the associated comminution.  We discussed treatment modalities ranging from conservative to surgical.  From a conservative standpoint, we discussed ongoing immobilization to allow for ongoing healing in the position it is currently in, however I explained that given the displacement, angulation and shortening of the injury, this would predispose to potential loss of range of motion and function at the wrist level.  From a surgical standpoint, we discussed open reduction internal fixation in order to restore the anatomy, promote healing and early mobilization of the wrist.   The  benefits of this procedure would be to promote fracture healing by providing stability and to heal the fracture in the appropriate alignment. The alternatives of this surgery would be to treat the fracture with immobilization in a splint/brace/cast or to do no intervention. The patient's questions were answered to satisfaction. After this discussion, patient elected to proceed with surgery. Informed consent was obtained.    Risks and benefits of the procedure were discussed, risks including but not limited to infection, bleeding, scarring, stiffness, nerve injury, tendon injury, vascular injury, hardware complication, recurrence of symptoms and need for subsequent operation.  Patient expressed understanding.  We will move forward with surgical scheduling of right wrist distal radius fracture open reduction internal fixation.    Follow-up: No follow-ups on file.   Meds & Orders: No orders of the defined types were placed in this encounter.   Orders Placed This Encounter  Procedures   XR Wrist Complete Right     Procedures: No procedures performed      Clinical History: No specialty comments available.  She reports that she has quit smoking. Her smoking use included cigarettes. She has a 30 pack-year smoking history. She has never used smokeless tobacco. No results for input(s): HGBA1C, LABURIC in the last 8760 hours.  Objective:   Vital Signs: There were no vitals taken for this visit.  Physical Exam  Gen: Well-appearing, in no acute distress; non-toxic CV: Regular Rate. Well-perfused. Warm.  Resp: Breathing unlabored on room air; no wheezing. Psych: Fluid speech in conversation; appropriate affect; normal thought process  Ortho Exam Left wrist - skin intact, minimal swelling - sensation intact m/r/u - AIN/PIN/IO intact -  ROM limited 2/2 pain - notable thumb basilar deformity   Imaging: XR Wrist Complete Right Result Date: 12/13/2023 Xrays demonstrate impacted comminuted  intraarticular fracture involving the distal radius with dorsal angulation of the distal fracture fragments, also with associated degenerative changes at thumb basilar joint    Past Medical/Family/Surgical/Social History: Medications & Allergies reviewed per EMR, new medications updated. There are no active problems to display for this patient.  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 Past Surgical History:  Procedure Laterality Date   ABDOMINAL HYSTERECTOMY     APPENDECTOMY     COLON SURGERY     Social History   Occupational History   Not on file  Tobacco Use   Smoking status: Former    Current packs/day: 1.00    Average packs/day: 1 pack/day for 30.0 years (30.0 ttl pk-yrs)    Types: Cigarettes   Smokeless tobacco: Never  Substance and Sexual Activity   Alcohol use: No   Drug use: No   Sexual activity: Not on file    [name], M.D. [facility], Hand Surgery

## 2023-12-14 ENCOUNTER — Encounter (HOSPITAL_BASED_OUTPATIENT_CLINIC_OR_DEPARTMENT_OTHER)
Admission: RE | Admit: 2023-12-14 | Discharge: 2023-12-14 | Disposition: A | Discharge status: Home / Self Care | Source: Ambulatory Visit | Attending: Orthopedic Surgery | Admitting: Orthopedic Surgery

## 2023-12-14 DIAGNOSIS — Z01812 Encounter for preprocedural laboratory examination: Secondary | ICD-10-CM | POA: Diagnosis present

## 2023-12-14 DIAGNOSIS — Z01818 Encounter for other preprocedural examination: Secondary | ICD-10-CM | POA: Insufficient documentation

## 2023-12-14 DIAGNOSIS — I10 Essential (primary) hypertension: Secondary | ICD-10-CM | POA: Diagnosis not present

## 2023-12-14 DIAGNOSIS — Z01810 Encounter for preprocedural cardiovascular examination: Secondary | ICD-10-CM | POA: Diagnosis present

## 2023-12-14 LAB — BASIC METABOLIC PANEL WITH GFR
Anion gap: 11 (ref 5–15)
BUN: 13 mg/dL (ref 8–23)
CO2: 25 mmol/L (ref 22–32)
Calcium: 10 mg/dL (ref 8.9–10.3)
Chloride: 103 mmol/L (ref 98–111)
Creatinine, Ser: 0.86 mg/dL (ref 0.44–1.00)
GFR, Estimated: >60 mL/min (ref >60)
Glucose, Bld: 97 mg/dL (ref 70–99)
Potassium: 4.8 mmol/L (ref 3.5–5.1)
Sodium: 139 mmol/L (ref 135–145)

## 2023-12-18 ENCOUNTER — Other Ambulatory Visit: Payer: Self-pay

## 2023-12-18 DIAGNOSIS — S52572A Other intraarticular fracture of lower end of left radius, initial encounter for closed fracture: Secondary | ICD-10-CM

## 2023-12-19 ENCOUNTER — Ambulatory Visit (HOSPITAL_BASED_OUTPATIENT_CLINIC_OR_DEPARTMENT_OTHER)

## 2023-12-19 ENCOUNTER — Other Ambulatory Visit: Payer: Self-pay

## 2023-12-19 ENCOUNTER — Ambulatory Visit (HOSPITAL_BASED_OUTPATIENT_CLINIC_OR_DEPARTMENT_OTHER)
Admission: RE | Admit: 2023-12-19 | Discharge: 2023-12-19 | Disposition: A | Discharge status: Home / Self Care | Attending: Orthopedic Surgery | Admitting: Orthopedic Surgery

## 2023-12-19 ENCOUNTER — Encounter (HOSPITAL_BASED_OUTPATIENT_CLINIC_OR_DEPARTMENT_OTHER)
Admission: RE | Disposition: A | Discharge status: Home / Self Care | Payer: Self-pay | Source: Home / Self Care | Attending: Orthopedic Surgery

## 2023-12-19 ENCOUNTER — Ambulatory Visit (HOSPITAL_BASED_OUTPATIENT_CLINIC_OR_DEPARTMENT_OTHER): Admitting: Certified Registered"

## 2023-12-19 ENCOUNTER — Encounter (HOSPITAL_BASED_OUTPATIENT_CLINIC_OR_DEPARTMENT_OTHER): Payer: Self-pay | Admitting: Orthopedic Surgery

## 2023-12-19 DIAGNOSIS — I10 Essential (primary) hypertension: Secondary | ICD-10-CM | POA: Insufficient documentation

## 2023-12-19 DIAGNOSIS — S52571A Other intraarticular fracture of lower end of right radius, initial encounter for closed fracture: Secondary | ICD-10-CM | POA: Insufficient documentation

## 2023-12-19 DIAGNOSIS — W19XXXA Unspecified fall, initial encounter: Secondary | ICD-10-CM | POA: Diagnosis not present

## 2023-12-19 DIAGNOSIS — Z87891 Personal history of nicotine dependence: Secondary | ICD-10-CM | POA: Diagnosis not present

## 2023-12-19 DIAGNOSIS — K219 Gastro-esophageal reflux disease without esophagitis: Secondary | ICD-10-CM | POA: Insufficient documentation

## 2023-12-19 DIAGNOSIS — S52501D Unspecified fracture of the lower end of right radius, subsequent encounter for closed fracture with routine healing: Secondary | ICD-10-CM

## 2023-12-19 DIAGNOSIS — S52501A Unspecified fracture of the lower end of right radius, initial encounter for closed fracture: Secondary | ICD-10-CM

## 2023-12-19 DIAGNOSIS — Z79899 Other long term (current) drug therapy: Secondary | ICD-10-CM | POA: Insufficient documentation

## 2023-12-19 HISTORY — PX: OPEN REDUCTION INTERNAL FIXATION (ORIF) DISTAL RADIAL FRACTURE: SHX5989

## 2023-12-19 SURGERY — OPEN REDUCTION INTERNAL FIXATION (ORIF) DISTAL RADIUS FRACTURE
Anesthesia: Monitor Anesthesia Care | Laterality: Right

## 2023-12-19 MED ORDER — ONDANSETRON HCL 4 MG/2ML IJ SOLN: 4.0000 mg | Freq: Once | INTRAMUSCULAR | Status: DC | PRN

## 2023-12-19 MED ORDER — FENTANYL CITRATE (PF) 100 MCG/2ML IJ SOLN
INTRAMUSCULAR | Status: AC
  Filled 2023-12-19: qty 2

## 2023-12-19 MED ORDER — FENTANYL CITRATE (PF) 100 MCG/2ML IJ SOLN
100.0000 ug | Freq: Once | INTRAMUSCULAR | Status: AC
  Administered 2023-12-19: 50 ug via INTRAVENOUS

## 2023-12-19 MED ORDER — CEFAZOLIN SODIUM-DEXTROSE 2-4 GM/100ML-% IV SOLN
2.0000 g | INTRAVENOUS | Status: AC
  Administered 2023-12-19: 2 g via INTRAVENOUS

## 2023-12-19 MED ORDER — PROPOFOL 500 MG/50ML IV EMUL
INTRAVENOUS | Status: DC | PRN
  Administered 2023-12-19: 100 ug/kg/min via INTRAVENOUS

## 2023-12-19 MED ORDER — 0.9 % SODIUM CHLORIDE (POUR BTL) OPTIME
TOPICAL | Status: DC | PRN
Start: 2023-12-19 — End: 2023-12-19
  Administered 2023-12-19: 1000 mL

## 2023-12-19 MED ORDER — ONDANSETRON HCL 4 MG/2ML IJ SOLN
INTRAMUSCULAR | Status: DC | PRN
  Administered 2023-12-19: 4 mg via INTRAVENOUS

## 2023-12-19 MED ORDER — OXYCODONE HCL 5 MG PO TABS
5.0000 mg | ORAL_TABLET | Freq: Four times a day (QID) | ORAL | 0 refills | Status: DC | PRN
Start: 2023-12-19 — End: 2024-02-02

## 2023-12-19 MED ORDER — CEFAZOLIN SODIUM-DEXTROSE 2-4 GM/100ML-% IV SOLN
INTRAVENOUS | Status: AC
  Filled 2023-12-19: qty 100

## 2023-12-19 MED ORDER — FENTANYL CITRATE (PF) 100 MCG/2ML IJ SOLN: 25.0000 ug | INTRAMUSCULAR | Status: DC | PRN

## 2023-12-19 MED ORDER — LACTATED RINGERS IV SOLN: INTRAVENOUS | Status: DC

## 2023-12-19 SURGICAL SUPPLY — 61 items
BIT DRILL SOLID 2.0X40MM (BIT) IMPLANT
BIT DRILL SOLID 2.5X40MM (BIT) IMPLANT
BLADE SURG 15 STRL LF DISP TIS (BLADE) ×2 IMPLANT
BNDG COHESIVE 4X5 TAN STRL LF (GAUZE/BANDAGES/DRESSINGS) ×1 IMPLANT
BNDG COMPR ESMARK 4X3 LF (GAUZE/BANDAGES/DRESSINGS) ×1 IMPLANT
BNDG ELASTIC 4INX 5YD STR LF (GAUZE/BANDAGES/DRESSINGS) ×2 IMPLANT
BNDG GAUZE DERMACEA FLUFF 4 (GAUZE/BANDAGES/DRESSINGS) ×1 IMPLANT
CHLORAPREP W/TINT 26 (MISCELLANEOUS) ×1 IMPLANT
CORD BIPOLAR FORCEPS 12FT (ELECTRODE) ×1 IMPLANT
COVER BACK TABLE 60X90IN (DRAPES) ×1 IMPLANT
CUFF TOURN SGL QUICK 18X4 (TOURNIQUET CUFF) ×1 IMPLANT
CUFF TRNQT CYL 24X4X16.5-23 (TOURNIQUET CUFF) IMPLANT
DERMABOND ADVANCED .7 DNX12 (GAUZE/BANDAGES/DRESSINGS) IMPLANT
DRAPE HAND 77X146 (DRAPES) ×1 IMPLANT
DRAPE OEC MINIVIEW 54X84 (DRAPES) ×1 IMPLANT
DRAPE SURG 17X23 STRL (DRAPES) ×1 IMPLANT
DRIVER QUICK CONNECT T10 (MISCELLANEOUS) IMPLANT
GAUZE PAD ABD 8X10 STRL (GAUZE/BANDAGES/DRESSINGS) ×1 IMPLANT
GAUZE SPONGE 4X4 12PLY STRL (GAUZE/BANDAGES/DRESSINGS) ×1 IMPLANT
GAUZE XEROFORM 1X8 LF (GAUZE/BANDAGES/DRESSINGS) ×1 IMPLANT
GLOVE BIO SURGEON STRL SZ7.5 (GLOVE) ×2 IMPLANT
GLOVE BIOGEL PI IND STRL 7.5 (GLOVE) ×2 IMPLANT
GOWN STRL REUS W/ TWL LRG LVL3 (GOWN DISPOSABLE) ×1 IMPLANT
GOWN STRL SURGICAL XL XLNG (GOWN DISPOSABLE) ×2 IMPLANT
GUIDE AIMING 1.5MM (WIRE) IMPLANT
MANIFOLD NEPTUNE II (INSTRUMENTS) ×1 IMPLANT
NDL HYPO 25X1 1.5 SAFETY (NEEDLE) IMPLANT
NDL HYPO 25X5/8 SAFETYGLIDE (NEEDLE) IMPLANT
NEEDLE HYPO 25X1 1.5 SAFETY (NEEDLE) IMPLANT
NEEDLE HYPO 25X5/8 SAFETYGLIDE (NEEDLE) IMPLANT
NS IRRIG 1000ML POUR BTL (IV SOLUTION) IMPLANT
PACK BASIN DAY SURGERY FS (CUSTOM PROCEDURE TRAY) ×1 IMPLANT
PAD CAST 4YDX4 CTTN HI CHSV (CAST SUPPLIES) ×2 IMPLANT
PEG GEMINUS THRD LOCK 2.3X17 (Peg) IMPLANT
PEG GEMINUS THRD LOCK 2.3X19 (Peg) IMPLANT
PEG GEMINUS THRD LOCK 2.3X20 (Peg) IMPLANT
PEG NON LOCK THREAD 2.7X24MM (Screw) IMPLANT
PEG THREADED LOCK 2.3X18 (Screw) IMPLANT
PLATE DISTAL RADIUS 4H (Plate) IMPLANT
SCREW CORT LOCK 3.5X12 TI (Screw) IMPLANT
SCREW POLY NON LOCK 3.5MMX12MM (Screw) IMPLANT
SCREWDRIVER SURG ST 2 (INSTRUMENTS) IMPLANT
SHEET MEDIUM DRAPE 40X70 STRL (DRAPES) ×1 IMPLANT
SLEEVE SCD COMPRESS KNEE MED (STOCKING) IMPLANT
SLING ARM FOAM STRAP LRG (SOFTGOODS) IMPLANT
SPIKE FLUID TRANSFER (MISCELLANEOUS) IMPLANT
SPLINT PLASTER CAST XFAST 4X15 (CAST SUPPLIES) ×10 IMPLANT
SPONGE T-LAP 4X18 ~~LOC~~+RFID (SPONGE) ×1 IMPLANT
STOCKINETTE IMPERVIOUS 9X36 MD (GAUZE/BANDAGES/DRESSINGS) ×1 IMPLANT
SUCTION TUBE FRAZIER 10FR DISP (SUCTIONS) ×1 IMPLANT
SUT ETHILON 4 0 PS 2 18 (SUTURE) ×1 IMPLANT
SUT MNCRL AB 3-0 PS2 18 (SUTURE) IMPLANT
SUT MNCRL AB 3-0 PS2 27 (SUTURE) ×1 IMPLANT
SUT MNCRL AB 4-0 PS2 18 (SUTURE) IMPLANT
SUT VIC AB 4-0 PS2 18 (SUTURE) IMPLANT
SYR BULB EAR ULCER 3OZ GRN STR (SYRINGE) ×2 IMPLANT
SYR CONTROL 10ML LL (SYRINGE) IMPLANT
TOWEL GREEN STERILE FF (TOWEL DISPOSABLE) ×2 IMPLANT
TUBE CONNECTING 20X1/4 (TUBING) ×1 IMPLANT
UNDERPAD 30X36 HEAVY ABSORB (UNDERPADS AND DIAPERS) ×1 IMPLANT
WIRE FIX 1.5 STD TIP (WIRE) IMPLANT

## 2023-12-19 NOTE — Anesthesia Preprocedure Evaluation (Addendum)
 Anesthesia Evaluation  Patient identified by MRN, date of birth, ID band Patient awake    Reviewed: Allergy & Precautions, NPO status , Patient's Chart, lab work & pertinent test results, reviewed documented beta blocker date and time   History of Anesthesia Complications Negative for: history of anesthetic complications  Airway Mallampati: I  TM Distance: >3 FB Neck ROM: Full    Dental  (+) Teeth Intact, Dental Advisory Given   Pulmonary former smoker   Pulmonary exam normal breath sounds clear to auscultation       Cardiovascular hypertension, Pt. on medications and Pt. on home beta blockers Normal cardiovascular exam Rhythm:Regular Rate:Normal     Neuro/Psych negative neurological ROS  negative psych ROS   GI/Hepatic Neg liver ROS,GERD  ,,  Endo/Other  negative endocrine ROS    Renal/GU negative Renal ROS     Musculoskeletal  (+) Arthritis ,    Abdominal   Peds  Hematology negative hematology ROS (+)   Anesthesia Other Findings Day of surgery medications reviewed with the patient.  H/o breast cancer   Reproductive/Obstetrics                              Anesthesia Physical Anesthesia Plan  ASA: 3  Anesthesia Plan: MAC and Regional   Post-op Pain Management: Regional block* and Tylenol  PO (pre-op)*   Induction: Intravenous  PONV Risk Score and Plan: 2 and TIVA, Dexamethasone and Ondansetron   Airway Management Planned: Natural Airway and Simple Face Mask  Additional Equipment:   Intra-op Plan:   Post-operative Plan:   Informed Consent: I have reviewed the patients History and Physical, chart, labs and discussed the procedure including the risks, benefits and alternatives for the proposed anesthesia with the patient or authorized representative who has indicated his/her understanding and acceptance.     Dental advisory given  Plan Discussed with: CRNA and  Anesthesiologist  Anesthesia Plan Comments:          Anesthesia Quick Evaluation

## 2023-12-19 NOTE — Discharge Instructions (Addendum)
 Hand Surgery Postop Instructions   Dressings: Maintain postoperative dressing until orthopedic follow-up.  Keep operative site clean and dry until orthopedic follow-up.  Wound Care: Keep your hand elevated above the level of your heart.  Do not allow it to dangle by your side. Moving your fingers is advised to stimulate circulation but will depend on the site of your surgery.  If you have a splint applied, your doctor will advise you regarding movement.  Activity: Do not drive or operate machinery until clearance given from physician. No heavy lifting with operative extremity.  Diet:  Drink liquids today or eat a light diet.  You may resume a regular diet tomorrow.    General expectations: Take prescribed medication if given, transition to over-the-counter medication as quickly as possible. Fingers may become slightly swollen.  Call your doctor if any of the following occur: Severe pain not relieved by pain medication. Elevated temperature. Dressing soaked with blood. Inability to move fingers. White or bluish color to fingers.   Per [facility] clinic policy, our goal is ensure optimal postoperative pain control with a multimodal pain management strategy. For all [facility] patients, our goal is to wean post-operative narcotic medications by 6 weeks post-operatively. If this is not possible due to utilization of pain medication prior to surgery, your [facility] doctor will support your acute post-operative pain control for the first 6 weeks postoperatively, with a plan to transition you back to your primary pain team following that. Cyndia Skeeters will work to ensure a Therapist, occupational.  Naeema Patlan Trevor Mace, M.D. Hand Surgery [facility]  Post Anesthesia Home Care Instructions  Activity: Get plenty of rest for the remainder of the day. A responsible individual must stay with you for 24 hours following the procedure.  For the next 24 hours, DO NOT: -Drive a  car -Advertising copywriter -Drink alcoholic beverages -Take any medication unless instructed by your physician -Make any legal decisions or sign important papers.  Meals: Start with liquid foods such as gelatin or soup. Progress to regular foods as tolerated. Avoid greasy, spicy, heavy foods. If nausea and/or vomiting occur, drink only clear liquids until the nausea and/or vomiting subsides. Call your physician if vomiting continues.  Special Instructions/Symptoms: Your throat may feel dry or sore from the anesthesia or the breathing tube placed in your throat during surgery. If this causes discomfort, gargle with warm salt water. The discomfort should disappear within 24 hours.  If you had a scopolamine patch placed behind your ear for the management of post- operative nausea and/or vomiting:  1. The medication in the patch is effective for 72 hours, after which it should be removed.  Wrap patch in a tissue and discard in the trash. Wash hands thoroughly with soap and water. 2. You may remove the patch earlier than 72 hours if you experience unpleasant side effects which may include dry mouth, dizziness or visual disturbances. 3. Avoid touching the patch. Wash your hands with soap and water after contact with the patch.    Regional Anesthesia Blocks  1. You may not be able to move or feel the "blocked" extremity after a regional anesthetic block. This may last may last from 3-48 hours after placement, but it will go away. The length of time depends on the medication injected and your individual response to the medication. As the nerves start to wake up, you may experience tingling as the movement and feeling returns to your extremity. If the numbness and inability to move your extremity  has not gone away after 48 hours, please call your surgeon.   2. The extremity that is blocked will need to be protected until the numbness is gone and the strength has returned. Because you cannot feel it, you  will need to take extra care to avoid injury. Because it may be weak, you may have difficulty moving it or using it. You may not know what position it is in without looking at it while the block is in effect.  3. For blocks in the legs and feet, returning to weight bearing and walking needs to be done carefully. You will need to wait until the numbness is entirely gone and the strength has returned. You should be able to move your leg and foot normally before you try and bear weight or walk. You will need someone to be with you when you first try to ensure you do not fall and possibly risk injury.  4. Bruising and tenderness at the needle site are common side effects and will resolve in a few days.  5. Persistent numbness or new problems with movement should be communicated to the surgeon or the [facility] [phone] [facility] [phone]).

## 2023-12-19 NOTE — Progress Notes (Signed)
Assisted Dr. Desmond Lope with right, axillary, ultrasound guided block. Side rails up, monitors on throughout procedure. See vital signs in flow sheet. Tolerated Procedure well.

## 2023-12-19 NOTE — Op Note (Addendum)
 NAME: Kristen Allen MEDICAL RECORD NO: [id number] DATE OF BIRTH: Nov 14, 1931 FACILITY: [facility] LOCATION: [facility] PHYSICIAN: GILDARDO ALDERTON, MD   OPERATIVE REPORT   DATE OF PROCEDURE: 12/19/23    PREOPERATIVE DIAGNOSIS: Right distal radius fracture, intra-articular   POSTOPERATIVE DIAGNOSIS: Right distal radius fracture, intra-articular   PROCEDURE: Right distal radius fracture open reduction internal fixation, intra-articular 3+ fragments   SURGEON:  GILDARDO ALDERTON, M.D.   ASSISTANT: Almeda Rummer, PA   ANESTHESIA:  Regional with sedation   INTRAVENOUS FLUIDS:  Per anesthesia flow sheet.   ESTIMATED BLOOD LOSS:  Minimal.   COMPLICATIONS:  None.   SPECIMENS:  none   TOURNIQUET TIME:  52 minutes   DISPOSITION:  Stable to PACU.   INDICATIONS: 89 year old female who sustained a mechanical fall onto the right wrist and was found to have a displaced, intra-articular, dorsally angulated distal radius fracture.  Given the ongoing displacement, intra-articular involvement and pain, patient was indicated for right wrist open reduction internal fixation in order to restore anatomy, promote early healing and mobilization.  Nonoperative versus operative options were discussed in detail and patient elects to proceed with surgery.  Risks and benefits of surgery were discussed including the risks of infection, bleeding, scarring, stiffness, nerve injury, vascular injury, tendon injury, need for subsequent operation, hardware failure, nonunion, malunion.  She voiced understanding of these risks and elected to proceed.  OPERATIVE COURSE: Patient was seen and identified in the preoperative area and marked appropriately.  Surgical consent had been signed. Preoperative IV antibiotic prophylaxis was given. She was transferred to the operating room and placed in supine position with the Right upper extremity on an arm board.  Sedation was induced by the anesthesiologist. A  regional block had been performed by anesthesia in preoperative holding.    Right upper extremity was prepped and draped in normal sterile orthopedic fashion.  A surgical pause was performed between the surgeons, anesthesia, and operating room staff and all were in agreement as to the patient, procedure, and site of procedure.  Tourniquet was placed and padded appropriately to the right upper arm.  The arm was exsanguinated and the tourniquet was inflated to 250 mmHg.  Longitudinal incision was designed over the volar radial aspect of the wrist, stemming from just proximal to the wrist crease into the forearm.  15 blade was utilized for skin, blunt dissection was performed down, all neurovascular structures were protected in their entirety throughout.  Radial artery was identified and protected.  Standard FCR approach was used.  Fibers over the FCR tendon were sectioned longitudinally, tendon was retracted ulnarly and FCR sheath was incised.  Blunt dissection performed, FPL also retracted ulnarly, after which pronator quadratus was elevated sharply from distal radius at the distal and radial borders in order to expose the underlying fracture site.  There was noted to be significant comminution at the fracture site.  Notable callus formation was also evident given the subacute nature of the fracture.  Osteotome was utilized to free up the underlying fracture fragment, rondure was utilized to remove callus.  Gentle reduction maneuver was performed of the fragments under biplanar fluoroscopy.  Once we were satisfied with our reduction, standard 4-hole [employer] plate of appropriate laterality was selected and temporarily affixed to bone utilizing a combination of clamp and K wires.  Biplanar fluoroscopy was once again utilized to confirm appropriate plate placement and maintenance of fracture reduction.  Shaft screw was then placed to secure the plate proximally.  Nonlocking screw  was utilized distally  initially to capture the dorsal comminution and restore volar tilt.  Volar tilt was noted to be restored appropriately, joint line was well reduced.  Remaining distal holes were filled utilizing locking screws taking care to ensure all screws remained extra-articular and beneath the joint line.  Nonlocking screw distally was then swapped for a locking screw of shorter variety to once again ensure screw remained well within bone.  Remaining shaft screws were all filled using combination of locking and nonlocking screws in bicortical fashion.  Distal radioulnar joint was then stressed and noted to be stable in all planes.  Final fluoroscopic views were taken utilizing biplanar fluoroscopy which confirmed appropriate plate placement, all fracture fragments had been secured appropriately and joint line had been restored as well.  Gentle flexion and extension of the wrist under fluoroscopy indicated stable appearance of the wrist with appropriately secured fracture fragments.  The tourniquet was deflated at 52 minutes.  Fingertips were pink with brisk capillary refill after deflation of tourniquet.  Copious irrigation was performed.  Layered closure was performed utilizing a combination of 3-0 Monocryl for subcutaneous layer and 4-0 nylon for the skin surface.  Sterile dressings were applied followed by application of a short arm volar slab splint utilizing plaster.  The operative drapes were broken down.  Sling was applied.  The patient was awoken from anesthesia safely and taken to PACU in stable condition.  I will see her back in the office in 2 weeks for postoperative followup.     Post-operative plan: The patient will recover in the post-anesthesia care unit and then be discharged home.  The patient will be non weight bearing on the right upper extremity in a short arm splint and sling when ambulatory.   I will see the patient back in the office in 2 weeks for postoperative followup.    A surgical assistant  was utilized throughout this procedure.  Their presence was helpful in protecting critical neurovascular structures and during the critical portions of the procedure.  The assistant was also critical in minimizing operative time and patient morbidity.   Tazaria Dlugosz, MD Electronically signed, 12/19/23

## 2023-12-19 NOTE — Progress Notes (Signed)
 Pt's glasses and hearing aids were given to the daughter by the CRNA before taking pt into the OR.

## 2023-12-19 NOTE — Anesthesia Procedure Notes (Signed)
 Anesthesia Regional Block: Axillary brachial plexus block   Pre-Anesthetic Checklist: , timeout performed,  Correct Patient, Correct Site, Correct Laterality,  Correct Procedure, Correct Position, site marked,  Risks and benefits discussed,  Surgical consent,  Pre-op evaluation,  At surgeon's request and post-op pain management  Laterality: Right  Prep: chloraprep       Needles:  Injection technique: Single-shot  Needle Type: Echogenic Needle     Needle Length: 9cm  Needle Gauge: 21     Additional Needles:   Procedures:,,,, ultrasound used (permanent image in chart),,    Narrative:  Start time: 12/19/2023 11:17 AM End time: 12/19/2023 11:27 AM Injection made incrementally with aspirations every 5 mL.  Performed by: Personally  Anesthesiologist: Corinne Garnette BRAVO, MD  Additional Notes: No pain on injection. No increased resistance to injection. Injection made in 5cc increments.  Good needle visualization.  Patient tolerated procedure well.

## 2023-12-19 NOTE — Interval H&P Note (Signed)
 History and Physical Interval Note:  12/19/2023 11:45 AM  Kristen Allen  has presented today for surgery, with the diagnosis of RIGHT DISTAL RADIUS FRACTURE.  The various methods of treatment have been discussed with the patient and family. After consideration of risks, benefits and other options for treatment, the patient has consented to  Procedure(s): OPEN REDUCTION INTERNAL FIXATION (ORIF) DISTAL RADIUS FRACTURE (Right) as a surgical intervention.  The patient's history has been reviewed, patient examined, no change in status, stable for surgery.  I have reviewed the patient's chart and labs.  Questions were answered to the patient's satisfaction.     Clella Mckeel

## 2023-12-20 ENCOUNTER — Encounter (HOSPITAL_BASED_OUTPATIENT_CLINIC_OR_DEPARTMENT_OTHER): Payer: Self-pay | Admitting: Orthopedic Surgery

## 2023-12-20 ENCOUNTER — Telehealth: Payer: Self-pay | Admitting: Orthopedic Surgery

## 2023-12-20 NOTE — Telephone Encounter (Signed)
 Patient called and said she just want to let you know that she has taking 6 of the oxycodone  just to get relief. She is asking if its ok to take tylenol  or Ibuprofen  with it just to keep the pain down.CB#[phone]

## 2023-12-20 NOTE — Telephone Encounter (Signed)
 I called and advised pt that this was ok

## 2023-12-20 NOTE — Anesthesia Postprocedure Evaluation (Signed)
 Anesthesia Post Note  Patient: Kristen Allen  Procedure(s) Performed: OPEN REDUCTION INTERNAL FIXATION (ORIF) DISTAL RADIUS FRACTURE (Right)     Patient location during evaluation: PACU Anesthesia Type: Regional Level of consciousness: awake and alert Pain management: pain level controlled Vital Signs Assessment: post-procedure vital signs reviewed and stable Respiratory status: spontaneous breathing, nonlabored ventilation and respiratory function stable Cardiovascular status: stable and blood pressure returned to baseline Postop Assessment: no apparent nausea or vomiting Anesthetic complications: no   No notable events documented.  Last Vitals:  Vitals:   12/19/23 1415 12/19/23 1431  BP: (!) 164/74 (!) 155/77  Pulse: (!) 57 63  Resp: 15 16  Temp:  (!) 36.2 C  SpO2: 98% 97%    Last Pain:                 Garnette FORBES Skillern

## 2023-12-20 NOTE — Transfer of Care (Signed)
 Immediate Anesthesia Transfer of Care Note  Patient: Kristen Allen  Procedure(s) Performed: OPEN REDUCTION INTERNAL FIXATION (ORIF) DISTAL RADIUS FRACTURE (Right)  Patient Location: PACU  Anesthesia Type:MAC  Level of Consciousness: awake and alert   Airway & Oxygen Therapy: Patient Spontanous Breathing and Patient connected to face mask oxygen  Post-op Assessment: Report given to RN and Post -op Vital signs reviewed and stable  Post vital signs: Reviewed and stable  Last Vitals:  Vitals Value Taken Time  BP 155/77 12/19/23 14:31  Temp 36.2 C 12/19/23 14:31  Pulse 63 12/19/23 14:31  Resp 16 12/19/23 14:31  SpO2 97 % 12/19/23 14:31    Last Pain:  Vitals:   12/19/23 1431  TempSrc:   PainSc: 0-No pain         Complications: No notable events documented.

## 2024-01-02 NOTE — Therapy (Incomplete)
 OUTPATIENT OCCUPATIONAL THERAPY ORTHO EVALUATION  Patient Name: Kristen Allen MRN: [id number] DOB:May 30, 1931, 89 y.o., female Today's Date: 01/03/2024  PCP: Abran FELIX MD REFERRING PROVIDER: Dr Arlinda  END OF SESSION:  OT End of Session - 01/03/24 0906     Visit Number 1    Number of Visits 12    Date for OT Re-Evaluation 02/29/24    Authorization Type Medicare    OT Start Time 0907    OT Stop Time 0951    OT Time Calculation (min) 44 min    Equipment Utilized During Treatment orthotic materials    Activity Tolerance Patient tolerated treatment well;No increased pain;Patient limited by fatigue;Patient limited by pain    Behavior During Therapy [facility] for tasks assessed/performed          Past Medical History:  Diagnosis Date   Abnormal mammogram    Arthritis 04/25/1963   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   Breast lump in female    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   Cough    Diverticulosis 04/24/2006   Esophageal reflux    Hypertension 04/24/2010   Past Surgical History:  Procedure Laterality Date   ABDOMINAL HYSTERECTOMY     APPENDECTOMY     COLON SURGERY     OPEN REDUCTION INTERNAL FIXATION (ORIF) DISTAL RADIAL FRACTURE Right 12/19/2023   Procedure: OPEN REDUCTION INTERNAL FIXATION (ORIF) DISTAL RADIUS FRACTURE;  Surgeon: Arlinda Buster, MD;  Location: [facility];  Service: Orthopedics;  Laterality: Right;   Patient Active Problem List   Diagnosis Date Noted   Closed fracture of right distal radius 12/19/2023    ONSET DATE: Date of surgery 12/19/2023  REFERRING DIAG: S52.572A (ICD-10-CM) - Other closed intra-articular fracture of distal end of left radius, initial encounter   THERAPY DIAG:  Pain in right wrist  Other lack of coordination  Muscle weakness (generalized)  Localized edema  Stiffness of right hand, not elsewhere classified  Stiffness of right wrist, not elsewhere classified  Rationale for Evaluation  and Treatment: Rehabilitation  SUBJECTIVE:   SUBJECTIVE STATEMENT: She is now approximately 2 weeks status post right wrist distal radius fracture and ORIF surgery.  She states not having significant pain now, just occasional twinges of pain.  She is here with her friend today who is driving her and helping her as she has difficulty hearing.     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  The hand surgeon had to rebreak her arm and add hardware for support.   PRECAUTIONS: Fall (slight valgus, will use close proximity and recommend PT for any balance issues)  RED FLAGS: None   WEIGHT BEARING RESTRICTIONS: Yes no significant weightbearing more than a pen in the right hand now and for the next 4 weeks  PAIN:  Are you having pain? Yes: NPRS scale: 0/10 at rest now, but spurts of aching up to 4-5/10  Pain location: Rt wrist Pain description: pins and needles  Aggravating factors: Movement Relieving factors: Rest  FALLS: Has patient fallen in last 6 months? Yes. Number of falls 1 (this accident-not considered a significant risk of falls)  LIVING ENVIRONMENT: Lives with: lives alone and lives in a skilled nursing facility Lives in: House/apartment Has following equipment at home: None  PLOF: Independent  PATIENT GOALS: To improve pain, motion, ability with right nondominant hand and arm  NEXT MD VISIT: 01/17/2024   OBJECTIVE: (All objective assessments  below are from initial evaluation on: 01/03/24 unless otherwise specified.)   HAND DOMINANCE: Left  ADLs: Overall ADLs: States decreased ability to grab, hold household objects, pain and difficulty to open containers, perform FMS tasks (manipulate fasteners on clothing), mild to moderate bathing problems as well.    FUNCTIONAL OUTCOME MEASURES: Eval: Quick DASH 52% impairment today  (Higher % Score  =  More Impairment)      UPPER EXTREMITY ROM      Shoulder to Wrist AROM Right eval  Shoulder flexion   Shoulder abduction   Shoulder extension   Shoulder internal rotation   Shoulder external rotation   Elbow flexion 150  Elbow extension (-25)   Forearm supination 38  Forearm pronation  50  Wrist flexion 18  Wrist extension 11  Wrist ulnar deviation   Wrist radial deviation   Functional dart thrower's motion (F-DTM) in ulnar flexion   F-DTM in radial extension    (Blank rows = not tested)   Hand AROM Right eval  Full Fist Ability (or Gap to Distal Palmar Crease) 7cm gap  Thumb Opposition  (Kapandji Scale)  3/10  Thumb MCP (0-60)   Thumb IP (0-80)   Thumb Radial Abduction Span   Thumb Palmar Abduction Span   Index MCP (0-90)   Index PIP (0-100)   Index DIP (0-70)    Long MCP (0-90)    Long PIP (0-100)    Long DIP (0-70)    Ring MCP (0-90)    Ring PIP (0-100)    Ring DIP (0-70)    Little MCP (0-90)    Little PIP (0-100)    Little DIP (0-70)    (Blank rows = not tested)   UPPER EXTREMITY MMT:    Eval:  NT at eval due to recent and still healing injuries. Will be tested when appropriate.   MMT Right TBD  Elbow flexion   Elbow extension   Forearm supination   Forearm pronation   Wrist flexion   Wrist extension   Wrist ulnar deviation   Wrist radial deviation   (Blank rows = not tested)  HAND FUNCTION: Eval: Observed weakness in affected Rt hand.  Grip strength Right: TBD lbs, Left: TBD lbs   COORDINATION: Eval: Observed coordination impairments with affected Rt hand. 9 Hole Peg Test Right: TBDsec (approx 30 sec is WFL)   SENSATION: Eval:  Light touch intact today,  though diminished around sx area hand and thumb  EDEMA:   Eval:  Mildly swollen in Rt hand and wrist today  COGNITION: Eval: Overall cognitive status: WFL for evaluation today   OBSERVATIONS:   Eval: Fingers very stiff and barely moving and thumb has some pain and stiffness also barely moving.  Surgical area has some healing  eschar around and surgical site is held together with glue.   Rt DRF, ORIF   TODAY'S TREATMENT:  Post-evaluation treatment:   For safety/self-care she was edu for no weight bearing in Lt arm for 2-4 weeks.  She can hold a pen or toothbrush only.  OT helps her carefully wash arm today advising her not to soak her arm or scrub on the surgical glue vigorously.  She should simply wash and dry her arm cautiously.   Custom orthotic fabrication was indicated due to pt's healing right distal radius fracture/ORIF and need for safe, functional positioning. OT fabricated custom wrist immobilization orthosis for pt today to protect the wrist and surgical area. It fit well with no areas of pressure, pt  states a comfortable fit. Pt was educated on the wearing schedule (on at all times except for hygiene and exercises), to avoid exposing it to sources of heat, to wipe clean as needed (do not wash, use harsh detergents), to call or come in ASAP if it is causing any irritation or is not achieving desired function. It will be checked/adjusted in upcoming sessions, as needed. Pt states understanding all directions.    She was also given the following HEP to do 3-4 x day, gently, not to force anything or cause pain.  She demo's back pain free and for understanding.   Exercises - Standing Shoulder Flexion Full Range  - 3-4 x daily - 1-2 sets - 10-15 reps - Bend and Straighten Elbow  - 3-4 x daily - 1-2 sets - 10-15 reps - Palm Up / Palm Down  - 3-4 x daily - 1-2 sets - 10-15 reps - Bend and Pull Back Wrist SLOWLY  - 3-4 x daily - 1-2 sets - 10-15 reps - Wrist AROM Dart Throwers Motion  - 3-4 x daily - 1-2 sets - 10-15 reps - Touch Thumb to [facility] Finger  - 3-4 x daily - 1-2 sets - 10 reps - Tendon Glides  - 3-4 x daily - 3-5 reps - 2-3 seconds hold  Patient Education - Scar Massage  PATIENT EDUCATION: Education details: See tx section above for details  Person educated: Patient Education method: Verbal  Instruction, Teach back, Handouts  Education comprehension: States and demonstrates understanding, Additional Education required    HOME EXERCISE PROGRAM: Access Code: [email or web address] URL: [email or web address] Date: 01/03/2024 Prepared by: Melvenia Ada   GOALS: Goals reviewed with patient? Yes   SHORT TERM GOALS: (STG required if POC>30 days) Target Date: 01/25/2024  Pt will obtain protective, custom orthotic. Goal status: 01/03/2024: Met  2.  Pt will demo/state understanding of initial HEP to improve pain levels and prerequisite motion. Goal status: INITIAL   LONG TERM GOALS: Target Date: 02/29/2024  Pt will improve functional ability by decreased impairment per QuickDASH assessment from 52% to 20% or better, for better quality of life. Goal status: INITIAL  2.  Pt will improve grip strength in Rt hand from unsafe to test to at least 15lbs for functional use at home and in IADLs. Goal status: INITIAL  3.  Pt will improve A/ROM in right wrist flexion/extension from 18/11 respectively to at least 45 degrees each, to have functional motion for tasks like reach and grasp.  Goal status: INITIAL  4.  Pt will improve strength in right wrist flexion/extension from apparent 3 -/5 MMT to at least 4/5 MMT to have increased functional ability to carry out selfcare and higher-level homecare tasks with less difficulty. Goal status: INITIAL  5.  Pt will improve coordination skills in right hand and arm, as seen by within functional limit score on nine-hole peg testing to have increased functional ability to carry out fine motor tasks (fasteners, etc.) and more complex, coordinated IADLs (meal prep, sports, etc.).  Goal status: INITIAL  6.  Pt will decrease pain at worst from 4-5/10 to 2/10 or better to have better sleep and occupational participation in daily roles. Goal status: INITIAL   ASSESSMENT:  CLINICAL IMPRESSION: Patient is a 89 y.o. female who was seen today for  occupational therapy evaluation for stiffness, weakness, and decreased functional ability and pain in the right nondominant wrist and arm after distal radius fracture and ORIF surgery.  The patient will benefit from outpatient occupational therapy  to decrease symptoms, improve functional upper extremity use, and increase quality of life.  PERFORMANCE DEFICITS: in functional skills including ADLs, IADLs, coordination, dexterity, proprioception, sensation, edema, ROM, strength, pain, fascial restrictions, muscle spasms, Fine motor control, Gross motor control, body mechanics, endurance, decreased knowledge of precautions, wound, and UE functional use, cognitive skills including problem solving and safety awareness, and psychosocial skills including coping strategies, environmental adaptation, habits, and routines and behaviors.   IMPAIRMENTS: are limiting patient from ADLs, IADLs, rest and sleep, and leisure.   COMORBIDITIES: may have co-morbidities  that affects occupational performance. Patient will benefit from skilled OT to address above impairments and improve overall function.  MODIFICATION OR ASSISTANCE TO COMPLETE EVALUATION: Min-Moderate modification of tasks or assist with assess necessary to complete an evaluation.  OT OCCUPATIONAL PROFILE AND HISTORY: Problem focused assessment: Including review of records relating to presenting problem.  CLINICAL DECISION MAKING: Moderate - several treatment options, min-mod task modification necessary  REHAB POTENTIAL: Good  EVALUATION COMPLEXITY: Low      PLAN:  OT FREQUENCY: 1-2x/week  OT DURATION: 8 weeks through 02/29/24 and up to 12 total visits as needed   PLANNED INTERVENTIONS: 97535 self care/ADL training, 02889 therapeutic exercise, 97530 therapeutic activity, 97112 neuromuscular re-education, 97140 manual therapy, 97035 ultrasound, 97032 electrical stimulation (manual), 97760 Orthotic Initial, [id number] Orthotic/Prosthetic subsequent,  compression bandaging, Dry needling, energy conservation, coping strategies training, and patient/family education  RECOMMENDED OTHER SERVICES: perhaps PT for gait and balance  CONSULTED AND AGREED WITH PLAN OF CARE: Patient  PLAN FOR NEXT SESSION:   Review initial HEP and recommendations, check orthosis, encourage light functional motor skills   Melvenia Ada, OTR/L, CHT  01/03/2024, 11:11 AM

## 2024-01-03 ENCOUNTER — Ambulatory Visit: Admitting: Orthopedic Surgery

## 2024-01-03 ENCOUNTER — Encounter: Payer: Self-pay | Admitting: Rehabilitative and Restorative Service Providers"

## 2024-01-03 ENCOUNTER — Ambulatory Visit (INDEPENDENT_AMBULATORY_CARE_PROVIDER_SITE_OTHER): Admitting: Rehabilitative and Restorative Service Providers"

## 2024-01-03 ENCOUNTER — Ambulatory Visit (INDEPENDENT_AMBULATORY_CARE_PROVIDER_SITE_OTHER): Payer: Self-pay

## 2024-01-03 DIAGNOSIS — R278 Other lack of coordination: Secondary | ICD-10-CM

## 2024-01-03 DIAGNOSIS — M6281 Muscle weakness (generalized): Secondary | ICD-10-CM

## 2024-01-03 DIAGNOSIS — M25641 Stiffness of right hand, not elsewhere classified: Secondary | ICD-10-CM

## 2024-01-03 DIAGNOSIS — M25631 Stiffness of right wrist, not elsewhere classified: Secondary | ICD-10-CM | POA: Diagnosis not present

## 2024-01-03 DIAGNOSIS — Z8781 Personal history of (healed) traumatic fracture: Secondary | ICD-10-CM

## 2024-01-03 DIAGNOSIS — M25531 Pain in right wrist: Secondary | ICD-10-CM

## 2024-01-03 DIAGNOSIS — S52572A Other intraarticular fracture of lower end of left radius, initial encounter for closed fracture: Secondary | ICD-10-CM

## 2024-01-03 DIAGNOSIS — Z9889 Other specified postprocedural states: Secondary | ICD-10-CM

## 2024-01-03 DIAGNOSIS — R600 Localized edema: Secondary | ICD-10-CM

## 2024-01-03 NOTE — Progress Notes (Signed)
   Kristen Allen - 89 y.o. female MRN [id number]  Date of birth: 31-Mar-1932  Office Visit Note: Visit Date: 01/03/2024 PCP: Abran Jon CROME, MD Referred by: Abran Jon CROME, MD  Subjective:  HPI: Kristen Allen is a 89 y.o. female who presents today for follow up 2 weeks status post Right distal radius fracture open reduction internal fixation.  She is doing well overall, pain is controlled  .  Pertinent ROS were reviewed with the patient and found to be negative unless otherwise specified above in HPI.   Assessment & Plan: Visit Diagnoses:  1. Other closed intra-articular fracture of distal end of left radius, initial encounter   2. S/P ORIF (open reduction internal fixation) fracture     Plan: She is doing quite well postoperatively.  X-rays today show stable appearance of the distal radius fracture with hardware fixation.  Should begin occupational therapy for range of motion exercises and fabrication of a short arm orthosis.  Follow-up with myself in 2 weeks  Follow-up: No follow-ups on file.   Meds & Orders: No orders of the defined types were placed in this encounter.   Orders Placed This Encounter  Procedures   XR Wrist Complete Right     Procedures: No procedures performed       Objective:   Vital Signs: There were no vitals taken for this visit.  Ortho Exam Right wrist: - Well-healing volar incision, notable ecchymotic staining throughout the volar forearm - Digital range of motion is fairly well-preserved, thumb opposition to the ring finger PIP, sensation intact in all distributions median/radial/ulnar, AIN/PIN/interosseous intact - Wrist range of motion flexion extension 15/10 without significant pain or crepitus  Imaging: XR Wrist Complete Right Result Date: 01/03/2024 Right wrist x-rays demonstrate stable appearance of the distal radius hardware fixation, no evidence of hardware failure or migration.    Kristen Allen, M.D. [facility], Hand Surgery

## 2024-01-03 NOTE — Addendum Note (Signed)
 Addended by: GEORGINA LIMES D on: 01/03/2024 11:12 AM   Modules accepted: Orders

## 2024-01-07 NOTE — Therapy (Signed)
 OUTPATIENT OCCUPATIONAL THERAPY TREATMENT NOTE  Patient Name: Kristen Allen MRN: [id number] DOB:[birth year], 89 y.o., female Today's Date: 01/08/2024  PCP: Abran FELIX MD REFERRING PROVIDER: Dr Arlinda  END OF SESSION:  OT End of Session - 01/08/24 1534     Visit Number 2    Number of Visits 12    Date for OT Re-Evaluation 02/29/24    Authorization Type Medicare    OT Start Time 1534    OT Stop Time 1555    OT Time Calculation (min) 21 min    Activity Tolerance Patient tolerated treatment well;No increased pain;Patient limited by fatigue;Patient limited by pain    Behavior During Therapy [facility] for tasks assessed/performed           Past Medical History:  Diagnosis Date   Abnormal mammogram    Arthritis 04/25/1963   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   Breast lump in female    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   Cough    Diverticulosis 04/24/2006   Esophageal reflux    Hypertension 04/24/2010   Past Surgical History:  Procedure Laterality Date   ABDOMINAL HYSTERECTOMY     APPENDECTOMY     COLON SURGERY     OPEN REDUCTION INTERNAL FIXATION (ORIF) DISTAL RADIAL FRACTURE Right 12/19/2023   Procedure: OPEN REDUCTION INTERNAL FIXATION (ORIF) DISTAL RADIUS FRACTURE;  Surgeon: Arlinda Buster, MD;  Location: [facility];  Service: Orthopedics;  Laterality: Right;   Patient Active Problem List   Diagnosis Date Noted   Closed fracture of right distal radius 12/19/2023    ONSET DATE: Date of surgery 12/19/2023  REFERRING DIAG: S52.572A (ICD-10-CM) - Other closed intra-articular fracture of distal end of left radius, initial encounter   THERAPY DIAG:  Pain in right wrist  Other lack of coordination  Muscle weakness (generalized)  Localized edema  Stiffness of right hand, not elsewhere classified  Stiffness of right wrist, not elsewhere classified  Rationale for Evaluation and Treatment: Rehabilitation  PERTINENT HISTORY: She  initially had a fall and 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  The hand surgeon had to rebreak her arm and add hardware for support.  She states not having significant pain now, just occasional twinges of pain.  She is here with her friend today who is driving her and helping her as she has difficulty hearing.     PRECAUTIONS: Fall (slight valgus, will use close proximity and recommend PT for any balance issues)  RED FLAGS: None   WEIGHT BEARING RESTRICTIONS: Yes no significant weightbearing more than a pen in the right hand now and for the next 4 weeks   SUBJECTIVE:   SUBJECTIVE STATEMENT: 3 weeks status post right wrist distal radius fracture and ORIF surgery.  She shows up very late, but OT is willing to see her for a short time today. She states wondering if she is doing better, having some tenderness and swelling.[name]      PAIN:  Are you having pain? Yes: NPRS scale: 0-1/10 at rest now, but spurts of aching up to 4-5/10  Pain location: Rt wrist Pain description: pins and needles  Aggravating factors: Movement Relieving factors: Rest  FALLS: Has patient fallen in last 6 months? Yes. Number of falls 1 (this accident-not considered a significant risk of falls)  PATIENT GOALS: To improve pain, motion, ability with right nondominant hand and arm  NEXT MD VISIT: 01/17/2024   OBJECTIVE: (All objective assessments below are from  initial evaluation on: 01/03/24 unless otherwise specified.)   HAND DOMINANCE: Left  ADLs: Overall ADLs: States decreased ability to grab, hold household objects, pain and difficulty to open containers, perform FMS tasks (manipulate fasteners on clothing), mild to moderate bathing problems as well.    FUNCTIONAL OUTCOME MEASURES: Eval: Quick DASH 52% impairment today  (Higher % Score  =  More Impairment)      UPPER EXTREMITY ROM     Shoulder to Wrist AROM Right eval Rt 01/08/24  Shoulder flexion    Shoulder  abduction    Shoulder extension    Shoulder internal rotation    Shoulder external rotation    Elbow flexion 150 150  Elbow extension (-25)  0  Forearm supination 38 66  Forearm pronation  50 40  Wrist flexion 18 24  Wrist extension 11 10  Wrist ulnar deviation    Wrist radial deviation    Functional dart thrower's motion (F-DTM) in ulnar flexion    F-DTM in radial extension     (Blank rows = not tested)   Hand AROM Right eval Rt 01/08/24  Full Fist Ability (or Gap to Distal Palmar Crease) 7cm gap 6mm gap   Thumb Opposition  (Kapandji Scale)  3/10   Thumb MCP (0-60)    Thumb IP (0-80)    Thumb Radial Abduction Span    Thumb Palmar Abduction Span    Index MCP (0-90)    Index PIP (0-100)    Index DIP (0-70)     Long MCP (0-90)     Long PIP (0-100)     Long DIP (0-70)     Ring MCP (0-90)     Ring PIP (0-100)     Ring DIP (0-70)     Little MCP (0-90)     Little PIP (0-100)     Little DIP (0-70)     (Blank rows = not tested)   UPPER EXTREMITY MMT:    Eval:  NT at eval due to recent and still healing injuries. Will be tested when appropriate.   MMT Right TBD  Elbow flexion   Elbow extension   Forearm supination   Forearm pronation   Wrist flexion   Wrist extension   Wrist ulnar deviation   Wrist radial deviation   (Blank rows = not tested)  HAND FUNCTION: Eval: Observed weakness in affected Rt hand.  Grip strength Right: TBD lbs, Left: TBD lbs   COORDINATION: 01/08/24: 9 Hole Peg Test Right: TBDsec (approx 30 sec is WFL)   SENSATION: Eval:  Light touch intact today,  though diminished around sx area hand and thumb  EDEMA:   Eval:  Mildly swollen in Rt hand and wrist today  COGNITION: Eval: Overall cognitive status: WFL for evaluation today   OBSERVATIONS:   Eval: Fingers very stiff and barely moving and thumb has some pain and stiffness also barely moving.  Surgical area has some healing eschar around and surgical site is held together with glue.    Rt DRF, ORIF   TODAY'S TREATMENT:  01/08/24: She was wearing her orthosis loosely, and was reminded that should be on snug.  She was given a compressive strip for her thumb to help with swelling and irritation.    We review her home exercise program, making some adjustments to form, ensuring nonpainful performance.    She also performs active range of motion for new measures today which do show improvements from the elbow to the hand.  She was encouraged to  keep doing light functional activities with 1 or 2 pounds or less like holding a pen or cleaning her glasses.  We reviewed safety/self-care precautions like nonweightbearing and wearing the orthosis at all times except showers and exercises.  Her and her son state understanding and leaves in no significant pain.   Exercises reviewed today: - Standing Shoulder Flexion Full Range  - 3-4 x daily - 1-2 sets - 10-15 reps - Bend and Straighten Elbow  - 3-4 x daily - 1-2 sets - 10-15 reps - Palm Up / Palm Down  - 3-4 x daily - 1-2 sets - 10-15 reps - Bend and Pull Back Wrist SLOWLY  - 3-4 x daily - 1-2 sets - 10-15 reps - Wrist AROM Dart Throwers Motion  - 3-4 x daily - 1-2 sets - 10-15 reps - Touch Thumb to [facility] Finger  - 3-4 x daily - 1-2 sets - 10 reps - Tendon Glides  - 3-4 x daily - 3-5 reps - 2-3 seconds hold  Patient Education - Scar Massage  PATIENT EDUCATION: Education details: See tx section above for details  Person educated: Patient Education method: Verbal Instruction, Teach back, Handouts  Education comprehension: States and demonstrates understanding, Additional Education required    HOME EXERCISE PROGRAM: Access Code: [email or web address] URL: [email or web address] Date: 01/03/2024 Prepared by: Melvenia Ada   GOALS: Goals reviewed with patient? Yes   SHORT TERM GOALS: (STG required if POC>30 days) Target Date: 01/25/2024  Pt will obtain protective, custom orthotic. Goal status: 01/03/2024: Met  2.   Pt will demo/state understanding of initial HEP to improve pain levels and prerequisite motion. Goal status: INITIAL   LONG TERM GOALS: Target Date: 02/29/2024  Pt will improve functional ability by decreased impairment per QuickDASH assessment from 52% to 20% or better, for better quality of life. Goal status: INITIAL  2.  Pt will improve grip strength in Rt hand from unsafe to test to at least 15lbs for functional use at home and in IADLs. Goal status: INITIAL  3.  Pt will improve A/ROM in right wrist flexion/extension from 18/11 respectively to at least 45 degrees each, to have functional motion for tasks like reach and grasp.  Goal status: INITIAL  4.  Pt will improve strength in right wrist flexion/extension from apparent 3 -/5 MMT to at least 4/5 MMT to have increased functional ability to carry out selfcare and higher-level homecare tasks with less difficulty. Goal status: INITIAL  5.  Pt will improve coordination skills in right hand and arm, as seen by within functional limit score on nine-hole peg testing to have increased functional ability to carry out fine motor tasks (fasteners, etc.) and more complex, coordinated IADLs (meal prep, sports, etc.).  Goal status: INITIAL  6.  Pt will decrease pain at worst from 4-5/10 to 2/10 or better to have better sleep and occupational participation in daily roles. Goal status: INITIAL   ASSESSMENT:  CLINICAL IMPRESSION: 01/08/24: She is improving, she should be taking it slow and not forcing anything.  Eval: Patient is a 89 y.o. female who was seen today for occupational therapy evaluation for stiffness, weakness, and decreased functional ability and pain in the right nondominant wrist and arm after distal radius fracture and ORIF surgery.  The patient will benefit from outpatient occupational therapy to decrease symptoms, improve functional upper extremity use, and increase quality of life.     PLAN:  OT FREQUENCY: 1-2x/week  OT  DURATION: 8 weeks through 02/29/24 and up to 12 total  visits as needed   PLANNED INTERVENTIONS: 97535 self care/ADL training, 02889 therapeutic exercise, 97530 therapeutic activity, 97112 neuromuscular re-education, 97140 manual therapy, 97035 ultrasound, [id number] electrical stimulation (manual), [id number] Orthotic Initial, [id number] Orthotic/Prosthetic subsequent, compression bandaging, Dry needling, energy conservation, coping strategies training, and patient/family education  RECOMMENDED OTHER SERVICES: perhaps PT for gait and balance  CONSULTED AND AGREED WITH PLAN OF CARE: Patient  PLAN FOR NEXT SESSION:   Upgrade to 4 weeks postop including light stretches as tolerated.  Melvenia Ada, OTR/L, CHT  01/08/2024, 3:58 PM

## 2024-01-08 ENCOUNTER — Ambulatory Visit (INDEPENDENT_AMBULATORY_CARE_PROVIDER_SITE_OTHER): Admitting: Rehabilitative and Restorative Service Providers"

## 2024-01-08 ENCOUNTER — Encounter: Payer: Self-pay | Admitting: Rehabilitative and Restorative Service Providers"

## 2024-01-08 DIAGNOSIS — R278 Other lack of coordination: Secondary | ICD-10-CM | POA: Diagnosis not present

## 2024-01-08 DIAGNOSIS — M6281 Muscle weakness (generalized): Secondary | ICD-10-CM

## 2024-01-08 DIAGNOSIS — R600 Localized edema: Secondary | ICD-10-CM | POA: Diagnosis not present

## 2024-01-08 DIAGNOSIS — M25531 Pain in right wrist: Secondary | ICD-10-CM | POA: Diagnosis not present

## 2024-01-08 DIAGNOSIS — M25631 Stiffness of right wrist, not elsewhere classified: Secondary | ICD-10-CM

## 2024-01-08 DIAGNOSIS — M25641 Stiffness of right hand, not elsewhere classified: Secondary | ICD-10-CM

## 2024-01-15 NOTE — Therapy (Signed)
 OUTPATIENT OCCUPATIONAL THERAPY TREATMENT NOTE  Patient Name: Kristen Allen MRN: [id number] DOB:01-13-1932, 89 y.o., female Today's Date: 01/17/2024  PCP: Abran FELIX MD REFERRING PROVIDER: Dr Arlinda  END OF SESSION:  OT End of Session - 01/17/24 1054     Visit Number 3    Number of Visits 12    Date for Recertification  02/29/24    Authorization Type Medicare    OT Start Time 1054    OT Stop Time 1140    OT Time Calculation (min) 46 min    Activity Tolerance Patient tolerated treatment well;No increased pain;Patient limited by fatigue;Patient limited by pain    Behavior During Therapy [facility] for tasks assessed/performed            Past Medical History:  Diagnosis Date   Abnormal mammogram    Arthritis 04/25/1963   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   Breast lump in female    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   Cough    Diverticulosis 04/24/2006   Esophageal reflux    Hypertension 04/24/2010   Past Surgical History:  Procedure Laterality Date   ABDOMINAL HYSTERECTOMY     APPENDECTOMY     COLON SURGERY     OPEN REDUCTION INTERNAL FIXATION (ORIF) DISTAL RADIAL FRACTURE Right 12/19/2023   Procedure: OPEN REDUCTION INTERNAL FIXATION (ORIF) DISTAL RADIUS FRACTURE;  Surgeon: Arlinda Buster, MD;  Location: [facility];  Service: Orthopedics;  Laterality: Right;   Patient Active Problem List   Diagnosis Date Noted   Closed fracture of right distal radius 12/19/2023    ONSET DATE: Date of surgery 12/19/2023  REFERRING DIAG: S52.572A (ICD-10-CM) - Other closed intra-articular fracture of distal end of left radius, initial encounter   THERAPY DIAG:  Pain in right wrist  Other lack of coordination  Muscle weakness (generalized)  Localized edema  Stiffness of right hand, not elsewhere classified  Stiffness of right wrist, not elsewhere classified  Rationale for Evaluation and Treatment: Rehabilitation  PERTINENT HISTORY: She  initially had a fall and 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  The hand surgeon had to rebreak her arm and add hardware for support.  She states not having significant pain now, just occasional twinges of pain.  She is here with her friend today who is driving her and helping her as she has difficulty hearing.     PRECAUTIONS: Fall (slight valgus, will use close proximity and recommend PT for any balance issues)  RED FLAGS: None   WEIGHT BEARING RESTRICTIONS: Yes no significant weightbearing more than a pen in the right hand now and for the next 4 weeks   SUBJECTIVE:   SUBJECTIVE STATEMENT: ~4 weeks status post right wrist distal radius fracture and ORIF surgery.  She states not having any resting pain but occasionally having pain through her volar forearm when trying to make a fist.  She states her thumb is the most irritating area and is likely arthritis.  She is with her daughter today    PAIN:  Are you having pain? Yes: NPRS scale:  0-1/10 at rest now, but spurts of aching up to 4-5/10  Pain location: Rt wrist Pain description: pins and needles  Aggravating factors: Movement Relieving factors: Rest   PATIENT GOALS: To improve pain, motion, ability with right nondominant hand and arm  NEXT MD VISIT: 01/17/2024   OBJECTIVE: (All objective assessments below are from initial evaluation on: 01/03/24 unless otherwise specified.)  HAND DOMINANCE: Left  ADLs: Overall ADLs: States decreased ability to grab, hold household objects, pain and difficulty to open containers, perform FMS tasks (manipulate fasteners on clothing), mild to moderate bathing problems as well.    FUNCTIONAL OUTCOME MEASURES: Eval: Quick DASH 52% impairment today  (Higher % Score  =  More Impairment)      UPPER EXTREMITY ROM     Shoulder to Wrist AROM Right eval Rt 01/08/24 Rt 01/17/24  Shoulder flexion     Shoulder abduction     Shoulder extension      Shoulder internal rotation     Shoulder external rotation     Elbow flexion 150 150   Elbow extension (-25)  0   Forearm supination 38 66 70  Forearm pronation  50 40 56  Wrist flexion 18 24 24   Wrist extension 11 10 20   Wrist ulnar deviation     Wrist radial deviation     Functional dart thrower's motion (F-DTM) in ulnar flexion     F-DTM in radial extension      (Blank rows = not tested)   Hand AROM Right eval Rt 01/08/24 Rt 01/17/24  Full Fist Ability (or Gap to Distal Palmar Crease) 7cm gap 6mm gap  6.2gap   Thumb Opposition  (Kapandji Scale)  3/10  4/10  Thumb MCP (0-60)     Thumb IP (0-80)     Thumb Radial Abduction Span     Thumb Palmar Abduction Span     Index MCP (0-90)     Index PIP (0-100)     Index DIP (0-70)      Long MCP (0-90)      Long PIP (0-100)      Long DIP (0-70)      Ring MCP (0-90)      Ring PIP (0-100)      Ring DIP (0-70)      Little MCP (0-90)      Little PIP (0-100)      Little DIP (0-70)      (Blank rows = not tested)   UPPER EXTREMITY MMT:    Eval:  NT at eval due to recent and still healing injuries. Will be tested when appropriate.   MMT Right TBD  Elbow flexion   Elbow extension   Forearm supination   Forearm pronation   Wrist flexion   Wrist extension   Wrist ulnar deviation   Wrist radial deviation   (Blank rows = not tested)  HAND FUNCTION: Eval: Observed weakness in affected Rt hand.  Grip strength Right: TBD lbs, Left: TBD lbs   COORDINATION: 01/22/24: 9 Hole Peg Test Right: TBDsec (approx 30 sec is WFL)   SENSATION: Eval:  Light touch intact today,  though diminished around sx area hand and thumb  EDEMA:   Eval:  Mildly swollen in Rt hand and wrist today  OBSERVATIONS:   Eval: Fingers very stiff and barely moving and thumb has some pain and stiffness also barely moving.  Surgical area has some healing eschar around and surgical site is held together with glue.   Rt DRF, ORIF   TODAY'S TREATMENT:  01/17/24:  She starts with active range of motion for exercise as well as new measures which shows good improvement at the forearm and somewhat wrist extension.  Thumb opposition is also minorly improved, but fingers still appear fairly stiff.  Today we upgrade to tolerable stretches as bolded below.  She was placed on moist heat for 5 minutes  while the exercises were explained.  OT does the stretches with her to ensure that they are not painful, and then she performs them back for understanding.  She should start with stretches and then try to move.  She was encouraged to keep avoiding pain, weightbearing, she should continue to keep massaging her scar in her arthritic thumb and hand.  Heat was encouraged before exercises now.  Not wearing the brace while seated in safe at a table again was encouraged so that she can develop some natural movement patterns.  She states understanding all directions, leaves without any significant pain or questions   Exercises - Standing Shoulder Flexion Full Range  - 3-4 x daily - 1-2 sets - 10-15 reps - Bend and Straighten Elbow  - 3-4 x daily - 1-2 sets - 10-15 reps - Palm Up / Palm Down  - 3-4 x daily - 1-2 sets - 10-15 reps - Wrist Flexion Stretch  - 4 x daily - 3-5 reps - 15 sec hold - Seated Wrist Extension Stretch and Hold, Wrist Flexor Stretch and Hold  - 4-6 x daily - 1 sets - 10-15 reps - Bend and Pull Back Wrist SLOWLY  - 3-4 x daily - 1-2 sets - 10-15 reps - Wrist AROM Dart Throwers Motion  - 3-4 x daily - 1-2 sets - 10-15 reps - Seated Finger Composite Flexion Stretch  - 4 x daily - 3-5 reps - 15 hold - Stretch thumb toward base of small finger (put hand in LAP)  - 2-3 x daily - 3-5 reps - 15 sec hold - Touch Thumb to [facility] Finger  - 3-4 x daily - 1-2 sets - 10 reps - Tendon Glides  - 3-4 x daily - 3-5 reps - 2-3 seconds hold   PATIENT EDUCATION: Education details: See tx section above for details  Person educated: Patient Education method: Verbal Instruction,  Teach back, Handouts  Education comprehension: States and demonstrates understanding, Additional Education required    HOME EXERCISE PROGRAM: Access Code: [email or web address] URL: [email or web address] Date: 01/03/2024 Prepared by: Melvenia Ada   GOALS: Goals reviewed with patient? Yes   SHORT TERM GOALS: (STG required if POC>30 days) Target Date: 01/25/2024  Pt will obtain protective, custom orthotic. Goal status: 01/03/2024: Met  2.  Pt will demo/state understanding of initial HEP to improve pain levels and prerequisite motion. Goal status: INITIAL   LONG TERM GOALS: Target Date: 02/29/2024  Pt will improve functional ability by decreased impairment per QuickDASH assessment from 52% to 20% or better, for better quality of life. Goal status: INITIAL  2.  Pt will improve grip strength in Rt hand from unsafe to test to at least 15lbs for functional use at home and in IADLs. Goal status: INITIAL  3.  Pt will improve A/ROM in right wrist flexion/extension from 18/11 respectively to at least 45 degrees each, to have functional motion for tasks like reach and grasp.  Goal status: INITIAL  4.  Pt will improve strength in right wrist flexion/extension from apparent 3 -/5 MMT to at least 4/5 MMT to have increased functional ability to carry out selfcare and higher-level homecare tasks with less difficulty. Goal status: INITIAL  5.  Pt will improve coordination skills in right hand and arm, as seen by within functional limit score on nine-hole peg testing to have increased functional ability to carry out fine motor tasks (fasteners, etc.) and more complex, coordinated IADLs (meal prep, sports, etc.).  Goal status: INITIAL  6.  Pt  will decrease pain at worst from 4-5/10 to 2/10 or better to have better sleep and occupational participation in daily roles. Goal status: INITIAL   ASSESSMENT:  CLINICAL IMPRESSION: 01/17/24: Her wrist and forearm are improving, but her thumb  arthritis and stiffness is holding her back somewhat.  This is not totally abnormal for just about 4 weeks postop.  She is motivated and working hard at home.      PLAN:  OT FREQUENCY: 1-2x/week  OT DURATION: 8 weeks through 02/29/24 and up to 12 total visits as needed   PLANNED INTERVENTIONS: 97535 self care/ADL training, 02889 therapeutic exercise, 97530 therapeutic activity, 97112 neuromuscular re-education, 97140 manual therapy, 97035 ultrasound, 97032 electrical stimulation (manual), 97760 Orthotic Initial, [id number] Orthotic/Prosthetic subsequent, compression bandaging, Dry needling, energy conservation, coping strategies training, and patient/family education  RECOMMENDED OTHER SERVICES: perhaps PT for gait and balance  CONSULTED AND AGREED WITH PLAN OF CARE: Patient  PLAN FOR NEXT SESSION:   Check motion, do a check of coordination.  Ensure stretches are going well and add some proprioceptive activities  Melvenia Ada, OTR/L, CHT  01/17/2024, 12:09 PM

## 2024-01-17 ENCOUNTER — Ambulatory Visit (INDEPENDENT_AMBULATORY_CARE_PROVIDER_SITE_OTHER)

## 2024-01-17 ENCOUNTER — Encounter: Payer: Self-pay | Admitting: Rehabilitative and Restorative Service Providers"

## 2024-01-17 ENCOUNTER — Ambulatory Visit (INDEPENDENT_AMBULATORY_CARE_PROVIDER_SITE_OTHER): Admitting: Rehabilitative and Restorative Service Providers"

## 2024-01-17 ENCOUNTER — Ambulatory Visit (INDEPENDENT_AMBULATORY_CARE_PROVIDER_SITE_OTHER): Admitting: Orthopedic Surgery

## 2024-01-17 DIAGNOSIS — Z9889 Other specified postprocedural states: Secondary | ICD-10-CM | POA: Diagnosis not present

## 2024-01-17 DIAGNOSIS — R600 Localized edema: Secondary | ICD-10-CM | POA: Diagnosis not present

## 2024-01-17 DIAGNOSIS — M6281 Muscle weakness (generalized): Secondary | ICD-10-CM

## 2024-01-17 DIAGNOSIS — M25641 Stiffness of right hand, not elsewhere classified: Secondary | ICD-10-CM

## 2024-01-17 DIAGNOSIS — M25531 Pain in right wrist: Secondary | ICD-10-CM | POA: Diagnosis not present

## 2024-01-17 DIAGNOSIS — Z8781 Personal history of (healed) traumatic fracture: Secondary | ICD-10-CM

## 2024-01-17 DIAGNOSIS — M25631 Stiffness of right wrist, not elsewhere classified: Secondary | ICD-10-CM

## 2024-01-17 DIAGNOSIS — R278 Other lack of coordination: Secondary | ICD-10-CM | POA: Diagnosis not present

## 2024-01-17 NOTE — Progress Notes (Signed)
   Kristen Allen - 89 y.o. female MRN [id number]  Date of birth: August 02, 1931  Office Visit Note: Visit Date: 01/17/2024 PCP: Abran Jon CROME, MD Referred by: Abran Jon CROME, MD  Subjective:  HPI: Kristen Allen is a 89 y.o. female who presents today for follow up 4 weeks status post Right distal radius fracture open reduction internal fixation .  Doing well overall, making progress with occupational therapy as scheduled.  Working on range of motion.  Pertinent ROS were reviewed with the patient and found to be negative unless otherwise specified above in HPI.   Assessment & Plan: Visit Diagnoses:  1. S/P ORIF (open reduction internal fixation) fracture     Plan: She is doing quite well postoperatively.  X-rays today demonstrate stable appearance of the distal radius fracture with appropriate hardware fixation.  Continue with range of motion protocol with occupational therapy for additional 4 weeks.  She will follow-up at that time, likely begin strengthening at that interval.  Follow-up: No follow-ups on file.   Meds & Orders: No orders of the defined types were placed in this encounter.   Orders Placed This Encounter  Procedures   XR Wrist Complete Right     Procedures: No procedures performed       Objective:   Vital Signs: There were no vitals taken for this visit.  Ortho Exam Right wrist: - Well-healed volar incision - Flexion/extension 25/20 - Supination/ pronation 75/55 - Attempted composite fist approximately 3 cm from distal palmar crease - Sensation intact median/radial/ulnar distributions, hand is warm well-perfused, AIN/PIN/interosseous intact  Imaging: XR Wrist Complete Right Result Date: 01/17/2024 Right wrist x-rays demonstrate stable appearance of the distal radius hardware fixation, no evidence of hardware failure or migration.  Mild residual ulnar positivity.    Kristen Allen Kristen Allen, M.D. [facility], Hand Surgery

## 2024-01-18 NOTE — Therapy (Signed)
 OUTPATIENT OCCUPATIONAL THERAPY TREATMENT NOTE  Patient Name: Kristen Allen MRN: [id number] DOB:10-22-1931, 89 y.o., female Today's Date: 01/22/2024  PCP: Abran FELIX MD REFERRING PROVIDER: Dr Arlinda  END OF SESSION:  OT End of Session - 01/22/24 1550     Visit Number 4    Number of Visits 12    Date for Recertification  02/29/24    Authorization Type Medicare    OT Start Time 1550    OT Stop Time 1640    OT Time Calculation (min) 50 min    Activity Tolerance Patient tolerated treatment well;No increased pain;Patient limited by fatigue;Patient limited by pain    Behavior During Therapy [facility] for tasks assessed/performed             Past Medical History:  Diagnosis Date   Abnormal mammogram    Arthritis 04/25/1963   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   Breast lump in female    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   Cough    Diverticulosis 04/24/2006   Esophageal reflux    Hypertension 04/24/2010   Past Surgical History:  Procedure Laterality Date   ABDOMINAL HYSTERECTOMY     APPENDECTOMY     COLON SURGERY     OPEN REDUCTION INTERNAL FIXATION (ORIF) DISTAL RADIAL FRACTURE Right 12/19/2023   Procedure: OPEN REDUCTION INTERNAL FIXATION (ORIF) DISTAL RADIUS FRACTURE;  Surgeon: Arlinda Buster, MD;  Location: [facility];  Service: Orthopedics;  Laterality: Right;   Patient Active Problem List   Diagnosis Date Noted   Closed fracture of right distal radius 12/19/2023    ONSET DATE: Date of surgery 12/19/2023  REFERRING DIAG: S52.572A (ICD-10-CM) - Other closed intra-articular fracture of distal end of left radius, initial encounter   THERAPY DIAG:  Pain in right wrist  Other lack of coordination  Muscle weakness (generalized)  Localized edema  Stiffness of right hand, not elsewhere classified  Stiffness of right wrist, not elsewhere classified  Rationale for Evaluation and Treatment: Rehabilitation  PERTINENT HISTORY: She  initially had a fall and 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  The hand surgeon had to rebreak her arm and add hardware for support.  She states not having significant pain now, just occasional twinges of pain.  She is here with her friend today who is driving her and helping her as she has difficulty hearing.     PRECAUTIONS: Fall (slight valgus, will use close proximity and recommend PT for any balance issues)  RED FLAGS: None   WEIGHT BEARING RESTRICTIONS: Yes no significant weightbearing more than a pen in the right hand now and for the next 4 weeks   SUBJECTIVE:   SUBJECTIVE STATEMENT: ~5 weeks status post right wrist distal radius fracture and ORIF surgery.  She states not having significant pain other than in her arthritic joints.  She is not wearing her orthosis, she states she left it at home today.  She is only less than 5 weeks postop and was recommended to wear this more conservatively when out of the home especially.   She is with her son today    PAIN:  Are you having pain? Yes: NPRS scale:   0-1/10 at rest now, but spurts of aching up to 2-3/10  Pain location: Rt wrist Pain description: pins and needles  Aggravating factors: Movement Relieving factors: Rest   PATIENT GOALS: To improve pain, motion, ability with right nondominant hand and arm  NEXT MD VISIT: 01/17/2024  OBJECTIVE: (All objective assessments below are from initial evaluation on: 01/03/24 unless otherwise specified.)   HAND DOMINANCE: Left  ADLs: Overall ADLs: States decreased ability to grab, hold household objects, pain and difficulty to open containers, perform FMS tasks (manipulate fasteners on clothing), mild to moderate bathing problems as well.    FUNCTIONAL OUTCOME MEASURES: Eval: Quick DASH 52% impairment today  (Higher % Score  =  More Impairment)      UPPER EXTREMITY ROM     Shoulder to Wrist AROM Right eval Rt 01/08/24 Rt 01/17/24  Rt 01/22/24  Shoulder flexion      Shoulder abduction      Shoulder extension      Shoulder internal rotation      Shoulder external rotation      Elbow flexion 150 150    Elbow extension (-25)  0    Forearm supination 38 66 70 80  Forearm pronation  50 40 56 75  Wrist flexion 18 24 24  36 (36* post manual)   Wrist extension 11 10 20 28  (40* post manual therapy)   Wrist ulnar deviation      Wrist radial deviation      Functional dart thrower's motion (F-DTM) in ulnar flexion      F-DTM in radial extension       (Blank rows = not tested)   Hand AROM Right eval Rt 01/08/24 Rt 01/17/24  Full Fist Ability (or Gap to Distal Palmar Crease) 7cm gap 6mm gap  6.2gap   Thumb Opposition  (Kapandji Scale)  3/10  4/10  Thumb MCP (0-60)     Thumb IP (0-80)     Thumb Radial Abduction Span     Thumb Palmar Abduction Span     Index MCP (0-90)     Index PIP (0-100)     Index DIP (0-70)      Long MCP (0-90)      Long PIP (0-100)      Long DIP (0-70)      Ring MCP (0-90)      Ring PIP (0-100)      Ring DIP (0-70)      Little MCP (0-90)      Little PIP (0-100)      Little DIP (0-70)      (Blank rows = not tested)   UPPER EXTREMITY MMT:    Eval:  NT at eval due to recent and still healing injuries. Will be tested when appropriate.   MMT Right TBD  Elbow flexion   Elbow extension   Forearm supination   Forearm pronation   Wrist flexion   Wrist extension   Wrist ulnar deviation   Wrist radial deviation   (Blank rows = not tested)  HAND FUNCTION: Eval: Observed weakness in affected Rt hand.  Grip strength Right: TBD lbs, Left: TBD lbs   COORDINATION: 01/22/24: 9 Hole Peg Test Right: 47sec (approx 35 sec is WFL. But 25sec is avg)   (she did 35sec in subsequent trials)   SENSATION: Eval:  Light touch intact today,  though diminished around sx area hand and thumb  EDEMA:   Eval:  Mildly swollen in Rt hand and wrist today  OBSERVATIONS:   Eval: Fingers very stiff and barely  moving and thumb has some pain and stiffness also barely moving.  Surgical area has some healing eschar around and surgical site is held together with glue.   Rt DRF, ORIF   TODAY'S TREATMENT:  01/22/24: She starts with active  range of motion for exercise as well as new measures which shows some good improvements in forearm and wrist motion since last week.  OT put her on moist heat while reviewing her recent home exercises.  Afterwards, OT does manual therapy joint mobilizations for wrist flexion and extension as well as gentle manual stretches with a review.  These things improve her motion as listed above-especially in extension.  We then reviewed and discussed safety with nonweightbearing, no strong gripping or pinching yet and wearing her orthosis as she is not wearing at all right now.  She was cautioned that if she would fall or hurt her wrist it could have dire consequences only 5 weeks out from her surgery.  We then perform proprioceptive activities for fine motor skills, wrist joint positioning and functional activities.  These things are a light, involved hand eye and hand wrist coordination, using small pegs, using a tennis ball to drop and catch, using a large ball to roll on the table, using a wrist maze and she was also recommended she could try at home rolling a small ping-pong ball around on the paper plate.  She does these activities with some difficulty slowly improving as she performs them.   She states no significant pain at the end of the session and actually states feeling looser and better.    Exercises reviewed today: - Standing Shoulder Flexion Full Range  - 3-4 x daily - 1-2 sets - 10-15 reps - Bend and Straighten Elbow  - 3-4 x daily - 1-2 sets - 10-15 reps - Palm Up / Palm Down  - 3-4 x daily - 1-2 sets - 10-15 reps - Wrist Flexion Stretch  - 4 x daily - 3-5 reps - 15 sec hold - Seated Wrist Extension Stretch and Hold, Wrist Flexor Stretch and Hold  - 4-6 x daily - 1  sets - 10-15 reps - Bend and Pull Back Wrist SLOWLY  - 3-4 x daily - 1-2 sets - 10-15 reps - Wrist AROM Dart Throwers Motion  - 3-4 x daily - 1-2 sets - 10-15 reps - Seated Finger Composite Flexion Stretch  - 4 x daily - 3-5 reps - 15 hold - Stretch thumb toward base of small finger (put hand in LAP)  - 2-3 x daily - 3-5 reps - 15 sec hold - Touch Thumb to [facility] Finger  - 3-4 x daily - 1-2 sets - 10 reps - Tendon Glides  - 3-4 x daily - 3-5 reps - 2-3 seconds hold   PATIENT EDUCATION: Education details: See tx section above for details  Person educated: Patient Education method: Verbal Instruction, Teach back, Handouts  Education comprehension: States and demonstrates understanding, Additional Education required    HOME EXERCISE PROGRAM: Access Code: [email or web address] URL: [email or web address] Date: 01/03/2024 Prepared by: Melvenia Ada   GOALS: Goals reviewed with patient? Yes   SHORT TERM GOALS: (STG required if POC>30 days) Target Date: 01/25/2024  Pt will obtain protective, custom orthotic. Goal status: 01/03/2024: Met  2.  Pt will demo/state understanding of initial HEP to improve pain levels and prerequisite motion. Goal status: INITIAL   LONG TERM GOALS: Target Date: 02/29/2024  Pt will improve functional ability by decreased impairment per QuickDASH assessment from 52% to 20% or better, for better quality of life. Goal status: INITIAL  2.  Pt will improve grip strength in Rt hand from unsafe to test to at least 15lbs for functional use at home and in IADLs. Goal status: INITIAL  3.  Pt will improve A/ROM in right wrist flexion/extension from 18/11 respectively to at least 45 degrees each, to have functional motion for tasks like reach and grasp.  Goal status: INITIAL  4.  Pt will improve strength in right wrist flexion/extension from apparent 3 -/5 MMT to at least 4/5 MMT to have increased functional ability to carry out selfcare and higher-level homecare  tasks with less difficulty. Goal status: INITIAL  5.  Pt will improve coordination skills in right hand and arm, as seen by within functional limit score on nine-hole peg testing to have increased functional ability to carry out fine motor tasks (fasteners, etc.) and more complex, coordinated IADLs (meal prep, sports, etc.).  Goal status: INITIAL  6.  Pt will decrease pain at worst from 4-5/10 to 2/10 or better to have better sleep and occupational participation in daily roles. Goal status: INITIAL   ASSESSMENT:  CLINICAL IMPRESSION: 01/22/24: She continues to improve forearm and wrist motion, hand is a bit stiff but coordination activity should help.  01/17/24: Her wrist and forearm are improving, but her thumb arthritis and stiffness is holding her back somewhat.  This is not totally abnormal for just about 4 weeks postop.  She is motivated and working hard at home.    PLAN:  OT FREQUENCY: 1-2x/week  OT DURATION: 8 weeks through 02/29/24 and up to 12 total visits as needed   PLANNED INTERVENTIONS: 97535 self care/ADL training, 02889 therapeutic exercise, 97530 therapeutic activity, 97112 neuromuscular re-education, 97140 manual therapy, 97035 ultrasound, 97032 electrical stimulation (manual), 97760 Orthotic Initial, [id number] Orthotic/Prosthetic subsequent, compression bandaging, Dry needling, energy conservation, coping strategies training, and patient/family education  RECOMMENDED OTHER SERVICES: perhaps PT for gait and balance  CONSULTED AND AGREED WITH PLAN OF CARE: Patient  PLAN FOR NEXT SESSION:   Check motion, coordination, start to encourage light gripping and pinching at 6 or 7 weeks postop.  Also consider dynamic mobilization orthosis as needed.  Melvenia Ada, OTR/L, CHT  01/22/2024, 4:47 PM

## 2024-01-22 ENCOUNTER — Ambulatory Visit (INDEPENDENT_AMBULATORY_CARE_PROVIDER_SITE_OTHER): Admitting: Rehabilitative and Restorative Service Providers"

## 2024-01-22 ENCOUNTER — Encounter: Payer: Self-pay | Admitting: Rehabilitative and Restorative Service Providers"

## 2024-01-22 DIAGNOSIS — R278 Other lack of coordination: Secondary | ICD-10-CM

## 2024-01-22 DIAGNOSIS — M6281 Muscle weakness (generalized): Secondary | ICD-10-CM | POA: Diagnosis not present

## 2024-01-22 DIAGNOSIS — R600 Localized edema: Secondary | ICD-10-CM

## 2024-01-22 DIAGNOSIS — M25641 Stiffness of right hand, not elsewhere classified: Secondary | ICD-10-CM

## 2024-01-22 DIAGNOSIS — M25531 Pain in right wrist: Secondary | ICD-10-CM

## 2024-01-22 DIAGNOSIS — M25631 Stiffness of right wrist, not elsewhere classified: Secondary | ICD-10-CM

## 2024-01-28 NOTE — Therapy (Signed)
 OUTPATIENT OCCUPATIONAL THERAPY TREATMENT NOTE  Patient Name: Kristen Allen MRN: [id number] DOB:07/14/1931, 89 y.o., female Today's Date: 01/29/2024  PCP: Abran FELIX MD REFERRING PROVIDER: Dr Arlinda  END OF SESSION:  OT End of Session - 01/29/24 1557     Visit Number 5    Number of Visits 12    Date for Recertification  02/29/24    Authorization Type Medicare    OT Start Time 1557    OT Stop Time 1641    OT Time Calculation (min) 44 min    Activity Tolerance Patient tolerated treatment well;No increased pain;Patient limited by fatigue;Patient limited by pain    Behavior During Therapy [facility] for tasks assessed/performed              Past Medical History:  Diagnosis Date   Abnormal mammogram    Arthritis 04/25/1963   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   Breast lump in female    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   Cough    Diverticulosis 04/24/2006   Esophageal reflux    Hypertension 04/24/2010   Past Surgical History:  Procedure Laterality Date   ABDOMINAL HYSTERECTOMY     APPENDECTOMY     COLON SURGERY     OPEN REDUCTION INTERNAL FIXATION (ORIF) DISTAL RADIAL FRACTURE Right 12/19/2023   Procedure: OPEN REDUCTION INTERNAL FIXATION (ORIF) DISTAL RADIUS FRACTURE;  Surgeon: Arlinda Buster, MD;  Location: [facility];  Service: Orthopedics;  Laterality: Right;   Patient Active Problem List   Diagnosis Date Noted   Closed fracture of right distal radius 12/19/2023    ONSET DATE: Date of surgery 12/19/2023  REFERRING DIAG: S52.572A (ICD-10-CM) - Other closed intra-articular fracture of distal end of left radius, initial encounter   THERAPY DIAG:  Pain in right wrist  Other lack of coordination  Muscle weakness (generalized)  Localized edema  Stiffness of right hand, not elsewhere classified  Stiffness of right wrist, not elsewhere classified  Rationale for Evaluation and Treatment: Rehabilitation  PERTINENT HISTORY:  She initially had a fall and 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  The hand surgeon had to rebreak her arm and add hardware for support.  She states not having significant pain now, just occasional twinges of pain.  She is here with her friend today who is driving her and helping her as she has difficulty hearing.     PRECAUTIONS: Fall (slight valgus, will use close proximity and recommend PT for any balance issues)  RED FLAGS: None   WEIGHT BEARING RESTRICTIONS: Yes no significant weightbearing more than a pen in the right hand now and for the next 4 weeks   SUBJECTIVE:   SUBJECTIVE STATEMENT: 6 weeks status post right wrist distal radius fracture and ORIF surgery.  She states thumb still numb and IF MCP J is painful-feels like arthritis.  She states that she tolerates stretches fairly well     She is with her son today    PAIN:  Are you having pain? Yes: NPRS scale:   0-1/10 at rest now, but spurts of aching up to 2-3/10  Pain location: Rt wrist Pain description: pins and needles  Aggravating factors: Movement Relieving factors: Rest   PATIENT GOALS: To improve pain, motion, ability with right nondominant hand and arm  NEXT MD VISIT: 01/17/2024   OBJECTIVE: (All objective assessments below are from initial evaluation on: 01/03/24 unless otherwise specified.)   HAND DOMINANCE: Left  ADLs: Overall ADLs:  States decreased ability to grab, hold household objects, pain and difficulty to open containers, perform FMS tasks (manipulate fasteners on clothing), mild to moderate bathing problems as well.    FUNCTIONAL OUTCOME MEASURES: Eval: Quick DASH 52% impairment today  (Higher % Score  =  More Impairment)      UPPER EXTREMITY ROM     Shoulder to Wrist AROM Right eval Rt 01/08/24 Rt 01/17/24 Rt 01/22/24 Rt 01/29/24  Shoulder flexion       Shoulder abduction       Shoulder extension       Shoulder internal rotation       Shoulder  external rotation       Elbow flexion 150 150     Elbow extension (-25)  0     Forearm supination 38 66 70 80 70  Forearm pronation  50 40 56 75 75  Wrist flexion 18 24 24  36 (36* post manual)  25  Wrist extension 11 10 20 28  (40* post manual therapy)  34  Wrist ulnar deviation       Wrist radial deviation       Functional dart thrower's motion (F-DTM) in ulnar flexion       F-DTM in radial extension        (Blank rows = not tested)   Hand AROM Right eval Rt 01/08/24 Rt 01/17/24  Full Fist Ability (or Gap to Distal Palmar Crease) 7cm gap 6mm gap  6.2gap   Thumb Opposition  (Kapandji Scale)  3/10  4/10  Thumb MCP (0-60)     Thumb IP (0-80)     Thumb Radial Abduction Span     Thumb Palmar Abduction Span     Index MCP (0-90)     Index PIP (0-100)     Index DIP (0-70)      Long MCP (0-90)      Long PIP (0-100)      Long DIP (0-70)      Ring MCP (0-90)      Ring PIP (0-100)      Ring DIP (0-70)      Little MCP (0-90)      Little PIP (0-100)      Little DIP (0-70)      (Blank rows = not tested)   UPPER EXTREMITY MMT:    Eval:  NT at eval due to recent and still healing injuries. Will be tested when appropriate.   MMT Right TBD  Elbow flexion   Elbow extension   Forearm supination   Forearm pronation   Wrist flexion   Wrist extension   Wrist ulnar deviation   Wrist radial deviation   (Blank rows = not tested)  HAND FUNCTION: 01/29/24: Grip strength Right: 6 lbs, Left: 45 lbs   COORDINATION: 01/22/24: 9 Hole Peg Test Right: 47sec (approx 35 sec is WFL. But 25sec is avg)   (she did 35sec in subsequent trials)   SENSATION: Eval:  Light touch intact today,  though diminished around sx area hand and thumb  EDEMA:   Eval:  Mildly swollen in Rt hand and wrist today  OBSERVATIONS:   Eval: Fingers very stiff and barely moving and thumb has some pain and stiffness also barely moving.  Surgical area has some healing eschar around and surgical site is held together with  glue.   Rt DRF, ORIF   TODAY'S TREATMENT:  01/29/24: He starts with active range of motion for exercise as well as new measures which shows minor  improvements but also some stagnation.  While on moist heat we review her home exercise program, discussed safety and OT suggest weaning from the orthosis a bit more often now perhaps 4 or 5 times a day for 15-minute light activities.  We then perform her home exercises together ensuring understanding, including the stretches that were educated on last time.  Again she tolerates these well.  To help with active range of motion and some nerve paresthesia to the thumb, OT does manual therapy cupping along her scar which she does perceive as some nerve tension.  She has no significant bruising or pain afterward.  She tolerates light gripping today with mild thumb arthritis pain.  So OT does manual therapy K taping to her thumb joint to help support it.  She does state it is supportive.  Lastly OT educates on isometric gripping on a towel to open up her webspace, increase her grip strength, increase the strength of her forearm.  She tolerates this very well when done isometrically.  Her and her son leaves without any additional questions, and she has no significant pain.   Exercises reviewed/performed today: (Bolded is new) - Palm Up / Palm Down  - 3-4 x daily - 1-2 sets - 10-15 reps - Wrist Flexion Stretch  - 4 x daily - 3-5 reps - 15 sec hold - Seated Wrist Extension Stretch and Hold, Wrist Flexor Stretch and Hold  - 4-6 x daily - 1 sets - 10-15 reps - Bend and Pull Back Wrist SLOWLY  - 3-4 x daily - 1-2 sets - 10-15 reps - Wrist AROM Dart Throwers Motion  - 3-4 x daily - 1-2 sets - 10-15 reps - Seated Finger Composite Flexion Stretch  - 4 x daily - 3-5 reps - 15 hold - Stretch thumb toward base of small finger (put hand in LAP)  - 2-3 x daily - 3-5 reps - 15 sec hold - Touch Thumb to [facility] Finger  - 3-4 x daily - 1-2 sets - 10 reps - Tendon Glides  - 3-4 x  daily - 3-5 reps - 2-3 seconds hold - Towel Roll Grip with Forearm in Neutral  - 3 x daily - 5 reps - 10 sec hold   PATIENT EDUCATION: Education details: See tx section above for details  Person educated: Patient Education method: Verbal Instruction, Teach back, Handouts  Education comprehension: States and demonstrates understanding, Additional Education required    HOME EXERCISE PROGRAM: Access Code: [email or web address] URL: [email or web address] Date: 01/03/2024 Prepared by: Melvenia Ada   GOALS: Goals reviewed with patient? Yes   SHORT TERM GOALS: (STG required if POC>30 days) Target Date: 01/25/2024  Pt will obtain protective, custom orthotic. Goal status: 01/03/2024: Met  2.  Pt will demo/state understanding of initial HEP to improve pain levels and prerequisite motion. Goal status: 01/29/24: MET   LONG TERM GOALS: Target Date: 02/29/2024  Pt will improve functional ability by decreased impairment per QuickDASH assessment from 52% to 20% or better, for better quality of life. Goal status: INITIAL  2.  Pt will improve grip strength in Rt hand from unsafe to test to at least 15lbs for functional use at home and in IADLs. Goal status: INITIAL  3.  Pt will improve A/ROM in right wrist flexion/extension from 18/11 respectively to at least 45 degrees each, to have functional motion for tasks like reach and grasp.  Goal status: INITIAL  4.  Pt will improve strength in right wrist flexion/extension from apparent 3 -/5 MMT  to at least 4/5 MMT to have increased functional ability to carry out selfcare and higher-level homecare tasks with less difficulty. Goal status: INITIAL  5.  Pt will improve coordination skills in right hand and arm, as seen by within functional limit score on nine-hole peg testing to have increased functional ability to carry out fine motor tasks (fasteners, etc.) and more complex, coordinated IADLs (meal prep, sports, etc.).  Goal status:  INITIAL  6.  Pt will decrease pain at worst from 4-5/10 to 2/10 or better to have better sleep and occupational participation in daily roles. Goal status: INITIAL   ASSESSMENT:  CLINICAL IMPRESSION: 01/29/24: She has some lingering stiffness, but taking a long time to heal might not be abnormal for her.  If she weans a bit more, and starts doing light grip training, this may help her unlock some additional motion safely.  She was told that it is normal to be stiff through 10 and even 12 weeks postop, and she needs to avoid any significant weight for at least 3 or 4 more weeks.     PLAN:  OT FREQUENCY: 1-2x/week  OT DURATION: 8 weeks through 02/29/24 and up to 12 total visits as needed   PLANNED INTERVENTIONS: 97535 self care/ADL training, 02889 therapeutic exercise, 97530 therapeutic activity, 97112 neuromuscular re-education, 97140 manual therapy, 97035 ultrasound, 97032 electrical stimulation (manual), 97760 Orthotic Initial, [id number] Orthotic/Prosthetic subsequent, compression bandaging, Dry needling, energy conservation, coping strategies training, and patient/family education  RECOMMENDED OTHER SERVICES: perhaps PT for gait and balance  CONSULTED AND AGREED WITH PLAN OF CARE: Patient  PLAN FOR NEXT SESSION:   Check grip strength again, consider adding arm strengthening as tolerated in the next 2 weeks.  Consider mobilization orthosis if needed.  Melvenia Ada, OTR/L, CHT  01/29/2024, 4:47 PM

## 2024-01-29 ENCOUNTER — Encounter: Payer: Self-pay | Admitting: Rehabilitative and Restorative Service Providers"

## 2024-01-29 ENCOUNTER — Ambulatory Visit: Admitting: Rehabilitative and Restorative Service Providers"

## 2024-01-29 DIAGNOSIS — M25631 Stiffness of right wrist, not elsewhere classified: Secondary | ICD-10-CM

## 2024-01-29 DIAGNOSIS — M25531 Pain in right wrist: Secondary | ICD-10-CM | POA: Diagnosis not present

## 2024-01-29 DIAGNOSIS — R600 Localized edema: Secondary | ICD-10-CM | POA: Diagnosis not present

## 2024-01-29 DIAGNOSIS — M6281 Muscle weakness (generalized): Secondary | ICD-10-CM

## 2024-01-29 DIAGNOSIS — R278 Other lack of coordination: Secondary | ICD-10-CM

## 2024-01-29 DIAGNOSIS — M25641 Stiffness of right hand, not elsewhere classified: Secondary | ICD-10-CM

## 2024-02-02 ENCOUNTER — Emergency Department (HOSPITAL_COMMUNITY)

## 2024-02-02 ENCOUNTER — Inpatient Hospital Stay (HOSPITAL_COMMUNITY)
Admission: EM | Admit: 2024-02-02 | Discharge: 2024-02-05 | DRG: 493 | Disposition: A | Discharge status: Skilled Nursing Facility | Attending: Internal Medicine | Admitting: Internal Medicine

## 2024-02-02 ENCOUNTER — Other Ambulatory Visit: Payer: Self-pay

## 2024-02-02 ENCOUNTER — Inpatient Hospital Stay (HOSPITAL_COMMUNITY): Admitting: Certified Registered Nurse Anesthetist

## 2024-02-02 ENCOUNTER — Inpatient Hospital Stay (HOSPITAL_COMMUNITY)

## 2024-02-02 ENCOUNTER — Encounter (HOSPITAL_COMMUNITY)
Admission: EM | Disposition: A | Discharge status: Skilled Nursing Facility | Payer: Self-pay | Source: Home / Self Care | Attending: Internal Medicine

## 2024-02-02 ENCOUNTER — Encounter (HOSPITAL_COMMUNITY): Payer: Self-pay

## 2024-02-02 DIAGNOSIS — Z888 Allergy status to other drugs, medicaments and biological substances status: Secondary | ICD-10-CM

## 2024-02-02 DIAGNOSIS — R296 Repeated falls: Secondary | ICD-10-CM | POA: Diagnosis present

## 2024-02-02 DIAGNOSIS — I952 Hypotension due to drugs: Secondary | ICD-10-CM | POA: Diagnosis present

## 2024-02-02 DIAGNOSIS — S82852A Displaced trimalleolar fracture of left lower leg, initial encounter for closed fracture: Principal | ICD-10-CM | POA: Diagnosis present

## 2024-02-02 DIAGNOSIS — Z886 Allergy status to analgesic agent status: Secondary | ICD-10-CM

## 2024-02-02 DIAGNOSIS — S70212A Abrasion, left hip, initial encounter: Secondary | ICD-10-CM

## 2024-02-02 DIAGNOSIS — D62 Acute posthemorrhagic anemia: Secondary | ICD-10-CM | POA: Diagnosis present

## 2024-02-02 DIAGNOSIS — K5900 Constipation, unspecified: Secondary | ICD-10-CM | POA: Diagnosis present

## 2024-02-02 DIAGNOSIS — M5441 Lumbago with sciatica, right side: Secondary | ICD-10-CM | POA: Diagnosis present

## 2024-02-02 DIAGNOSIS — Z66 Do not resuscitate: Secondary | ICD-10-CM | POA: Diagnosis not present

## 2024-02-02 DIAGNOSIS — K219 Gastro-esophageal reflux disease without esophagitis: Secondary | ICD-10-CM | POA: Diagnosis present

## 2024-02-02 DIAGNOSIS — S82822A Torus fracture of lower end of left fibula, initial encounter for closed fracture: Secondary | ICD-10-CM | POA: Diagnosis not present

## 2024-02-02 DIAGNOSIS — Z885 Allergy status to narcotic agent status: Secondary | ICD-10-CM

## 2024-02-02 DIAGNOSIS — M810 Age-related osteoporosis without current pathological fracture: Secondary | ICD-10-CM | POA: Diagnosis present

## 2024-02-02 DIAGNOSIS — K2270 Barrett's esophagus without dysplasia: Secondary | ICD-10-CM | POA: Diagnosis present

## 2024-02-02 DIAGNOSIS — Z974 Presence of external hearing-aid: Secondary | ICD-10-CM

## 2024-02-02 DIAGNOSIS — Z881 Allergy status to other antibiotic agents status: Secondary | ICD-10-CM | POA: Diagnosis not present

## 2024-02-02 DIAGNOSIS — I10 Essential (primary) hypertension: Secondary | ICD-10-CM | POA: Diagnosis present

## 2024-02-02 DIAGNOSIS — Z87891 Personal history of nicotine dependence: Secondary | ICD-10-CM

## 2024-02-02 DIAGNOSIS — Z85038 Personal history of other malignant neoplasm of large intestine: Secondary | ICD-10-CM

## 2024-02-02 DIAGNOSIS — Z79899 Other long term (current) drug therapy: Secondary | ICD-10-CM

## 2024-02-02 DIAGNOSIS — W1830XA Fall on same level, unspecified, initial encounter: Secondary | ICD-10-CM | POA: Diagnosis present

## 2024-02-02 DIAGNOSIS — S70219A Abrasion, unspecified hip, initial encounter: Secondary | ICD-10-CM | POA: Diagnosis present

## 2024-02-02 DIAGNOSIS — J189 Pneumonia, unspecified organism: Secondary | ICD-10-CM | POA: Diagnosis present

## 2024-02-02 DIAGNOSIS — T502X5A Adverse effect of carbonic-anhydrase inhibitors, benzothiadiazides and other diuretics, initial encounter: Secondary | ICD-10-CM | POA: Diagnosis present

## 2024-02-02 DIAGNOSIS — Z90710 Acquired absence of both cervix and uterus: Secondary | ICD-10-CM

## 2024-02-02 DIAGNOSIS — R55 Syncope and collapse: Principal | ICD-10-CM

## 2024-02-02 HISTORY — PX: EXTERNAL FIXATION, ANKLE: SHX7570

## 2024-02-02 HISTORY — DX: Unspecified hearing loss, unspecified ear: H91.90

## 2024-02-02 LAB — COMPREHENSIVE METABOLIC PANEL WITH GFR
ALT: 16 U/L (ref 0–44)
AST: 21 U/L (ref 15–41)
Albumin: 3.6 g/dL (ref 3.5–5.0)
Alkaline Phosphatase: 52 U/L (ref 38–126)
Anion gap: 10 (ref 5–15)
BUN: 14 mg/dL (ref 8–23)
CO2: 26 mmol/L (ref 22–32)
Calcium: 9.2 mg/dL (ref 8.9–10.3)
Chloride: 101 mmol/L (ref 98–111)
Creatinine, Ser: 0.9 mg/dL (ref 0.44–1.00)
GFR, Estimated: >60 mL/min (ref >60)
Glucose, Bld: 110 mg/dL — ABNORMAL HIGH (ref 70–99)
Potassium: 3.9 mmol/L (ref 3.5–5.1)
Sodium: 137 mmol/L (ref 135–145)
Total Bilirubin: 0.7 mg/dL (ref 0.0–1.2)
Total Protein: 6.1 g/dL — ABNORMAL LOW (ref 6.5–8.1)

## 2024-02-02 LAB — VITAMIN D 25 HYDROXY (VIT D DEFICIENCY, FRACTURES): Vit D, 25-Hydroxy: 57.1 ng/mL (ref 30–100)

## 2024-02-02 LAB — CBC
HCT: 34.1 % — ABNORMAL LOW (ref 36.0–46.0)
Hemoglobin: 11 g/dL — ABNORMAL LOW (ref 12.0–15.0)
MCH: 30.8 pg (ref 26.0–34.0)
MCHC: 32.3 g/dL (ref 30.0–36.0)
MCV: 95.5 fL (ref 80.0–100.0)
Platelets: 272 K/uL (ref 150–400)
RBC: 3.57 MIL/uL — ABNORMAL LOW (ref 3.87–5.11)
RDW: 14.2 % (ref 11.5–15.5)
WBC: 6.5 K/uL (ref 4.0–10.5)
nRBC: 0 % (ref 0.0–0.2)

## 2024-02-02 LAB — I-STAT CHEM 8, ED
BUN: 18 mg/dL (ref 8–23)
Calcium, Ion: 1.23 mmol/L (ref 1.15–1.40)
Chloride: 100 mmol/L (ref 98–111)
Creatinine, Ser: 0.9 mg/dL (ref 0.44–1.00)
Glucose, Bld: 103 mg/dL — ABNORMAL HIGH (ref 70–99)
HCT: 33 % — ABNORMAL LOW (ref 36.0–46.0)
Hemoglobin: 11.2 g/dL — ABNORMAL LOW (ref 12.0–15.0)
Potassium: 4.1 mmol/L (ref 3.5–5.1)
Sodium: 138 mmol/L (ref 135–145)
TCO2: 27 mmol/L (ref 22–32)

## 2024-02-02 LAB — TROPONIN I (HIGH SENSITIVITY)
Troponin I (High Sensitivity): 8 ng/L (ref <18)
Troponin I (High Sensitivity): 7 ng/L (ref <18)

## 2024-02-02 LAB — SURGICAL PCR SCREEN
MRSA, PCR: NEGATIVE
Staphylococcus aureus: NEGATIVE

## 2024-02-02 SURGERY — EXTERNAL FIXATION, ANKLE
Anesthesia: General | Laterality: Left

## 2024-02-02 MED ORDER — HYDROCODONE-ACETAMINOPHEN 5-325 MG PO TABS
1.0000 | ORAL_TABLET | Freq: Four times a day (QID) | ORAL | Status: DC | PRN
  Administered 2024-02-03: 1 via ORAL
  Administered 2024-02-04: 2 via ORAL
  Administered 2024-02-05: 1 via ORAL
  Filled 2024-02-02: qty 1
  Filled 2024-02-02: qty 2
  Filled 2024-02-02: qty 1

## 2024-02-02 MED ORDER — METOPROLOL SUCCINATE ER 25 MG PO TB24
50.0000 mg | ORAL_TABLET | Freq: Every day | ORAL | Status: DC
  Filled 2024-02-02: qty 2

## 2024-02-02 MED ORDER — CEFAZOLIN SODIUM-DEXTROSE 2-4 GM/100ML-% IV SOLN
2.0000 g | Freq: Once | INTRAVENOUS | Status: AC
  Administered 2024-02-02: 2 g via INTRAVENOUS

## 2024-02-02 MED ORDER — SENNOSIDES-DOCUSATE SODIUM 8.6-50 MG PO TABS: 1.0000 | ORAL_TABLET | Freq: Every evening | ORAL | Status: DC | PRN

## 2024-02-02 MED ORDER — CEFAZOLIN SODIUM-DEXTROSE 2-4 GM/100ML-% IV SOLN
INTRAVENOUS | Status: AC
  Filled 2024-02-02: qty 100

## 2024-02-02 MED ORDER — METHOCARBAMOL 1000 MG/10ML IJ SOLN
500.0000 mg | Freq: Three times a day (TID) | INTRAMUSCULAR | Status: DC | PRN
  Administered 2024-02-02 – 2024-02-03 (×3): 500 mg via INTRAVENOUS
  Filled 2024-02-02 (×3): qty 10

## 2024-02-02 MED ORDER — ACETAMINOPHEN 10 MG/ML IV SOLN
1000.0000 mg | Freq: Four times a day (QID) | INTRAVENOUS | Status: AC
  Administered 2024-02-02 (×3): 1000 mg via INTRAVENOUS
  Filled 2024-02-02 (×4): qty 100

## 2024-02-02 MED ORDER — FENTANYL CITRATE (PF) 50 MCG/ML IJ SOSY
100.0000 ug | PREFILLED_SYRINGE | Freq: Once | INTRAMUSCULAR | Status: AC
  Administered 2024-02-02: 100 ug via INTRAVENOUS
  Filled 2024-02-02: qty 2

## 2024-02-02 MED ORDER — LIDOCAINE 5 % EX PTCH
1.0000 | MEDICATED_PATCH | CUTANEOUS | Status: DC
  Administered 2024-02-02 – 2024-02-03 (×2): 1 via TRANSDERMAL
  Filled 2024-02-02 (×4): qty 1

## 2024-02-02 MED ORDER — EPHEDRINE 5 MG/ML INJ
INTRAVENOUS | Status: AC
  Filled 2024-02-02: qty 10

## 2024-02-02 MED ORDER — CYANOCOBALAMIN 500 MCG PO TABS
250.0000 ug | ORAL_TABLET | Freq: Every day | ORAL | Status: DC
  Filled 2024-02-02: qty 1

## 2024-02-02 MED ORDER — ONDANSETRON HCL 4 MG/2ML IJ SOLN
INTRAMUSCULAR | Status: AC
Start: 2024-02-02 — End: 2024-02-02
  Filled 2024-02-02: qty 4

## 2024-02-02 MED ORDER — CHLORHEXIDINE GLUCONATE 0.12 % MT SOLN
15.0000 mL | Freq: Once | OROMUCOSAL | Status: AC
  Administered 2024-02-02: 15 mL via OROMUCOSAL

## 2024-02-02 MED ORDER — ONDANSETRON HCL 4 MG/2ML IJ SOLN
4.0000 mg | Freq: Four times a day (QID) | INTRAMUSCULAR | Status: DC | PRN
  Administered 2024-02-02: 4 mg via INTRAVENOUS
  Filled 2024-02-02: qty 2

## 2024-02-02 MED ORDER — SODIUM CHLORIDE 0.9 % IR SOLN
Status: DC | PRN
  Administered 2024-02-02: 1000 mL

## 2024-02-02 MED ORDER — FENTANYL CITRATE (PF) 50 MCG/ML IJ SOSY
50.0000 ug | PREFILLED_SYRINGE | INTRAMUSCULAR | Status: DC | PRN
  Filled 2024-02-02: qty 1

## 2024-02-02 MED ORDER — FENTANYL CITRATE (PF) 250 MCG/5ML IJ SOLN
INTRAMUSCULAR | Status: AC
  Filled 2024-02-02: qty 5

## 2024-02-02 MED ORDER — METOPROLOL SUCCINATE ER 25 MG PO TB24
25.0000 mg | ORAL_TABLET | Freq: Every day | ORAL | Status: DC
Start: 2024-02-02 — End: 2024-02-05
  Administered 2024-02-03 – 2024-02-05 (×3): 25 mg via ORAL
  Filled 2024-02-02 (×4): qty 1

## 2024-02-02 MED ORDER — ORAL CARE MOUTH RINSE: 15.0000 mL | Freq: Once | OROMUCOSAL | Status: AC

## 2024-02-02 MED ORDER — ROCURONIUM BROMIDE 10 MG/ML (PF) SYRINGE
PREFILLED_SYRINGE | INTRAVENOUS | Status: AC
Start: 2024-02-02 — End: 2024-02-02
  Filled 2024-02-02: qty 30

## 2024-02-02 MED ORDER — PROPOFOL 10 MG/ML IV BOLUS
INTRAVENOUS | Status: DC | PRN
Start: 2024-02-02 — End: 2024-02-02
  Administered 2024-02-02: 70 mg via INTRAVENOUS

## 2024-02-02 MED ORDER — ORAL CARE MOUTH RINSE: 15.0000 mL | OROMUCOSAL | Status: DC | PRN

## 2024-02-02 MED ORDER — HEPARIN SODIUM (PORCINE) 5000 UNIT/ML IJ SOLN
5000.0000 [IU] | Freq: Three times a day (TID) | INTRAMUSCULAR | Status: DC
  Administered 2024-02-02 – 2024-02-05 (×9): 5000 [IU] via SUBCUTANEOUS
  Filled 2024-02-02 (×9): qty 1

## 2024-02-02 MED ORDER — METOPROLOL SUCCINATE ER 25 MG PO TB24
25.0000 mg | ORAL_TABLET | Freq: Every day | ORAL | Status: DC
Start: 2024-02-02 — End: 2024-02-02

## 2024-02-02 MED ORDER — SODIUM CHLORIDE 0.9 % IV SOLN
3.0000 g | Freq: Once | INTRAVENOUS | Status: AC
  Administered 2024-02-02: 3 g via INTRAVENOUS
  Filled 2024-02-02: qty 8

## 2024-02-02 MED ORDER — HYDROCHLOROTHIAZIDE 12.5 MG PO TABS
12.5000 mg | ORAL_TABLET | Freq: Every day | ORAL | Status: DC
  Filled 2024-02-02: qty 1

## 2024-02-02 MED ORDER — SODIUM CHLORIDE 0.9 % IV SOLN
3.0000 g | Freq: Three times a day (TID) | INTRAVENOUS | Status: AC
  Administered 2024-02-02: 3 g via INTRAVENOUS
  Filled 2024-02-02: qty 8

## 2024-02-02 MED ORDER — ONDANSETRON HCL 4 MG/2ML IJ SOLN
INTRAMUSCULAR | Status: DC | PRN
  Administered 2024-02-02: 4 mg via INTRAVENOUS

## 2024-02-02 MED ORDER — LIDOCAINE 2% (20 MG/ML) 5 ML SYRINGE
INTRAMUSCULAR | Status: DC | PRN
  Administered 2024-02-02: 40 mg via INTRAVENOUS

## 2024-02-02 MED ORDER — FENTANYL CITRATE (PF) 50 MCG/ML IJ SOSY
50.0000 ug | PREFILLED_SYRINGE | Freq: Once | INTRAMUSCULAR | Status: AC
  Administered 2024-02-02: 50 ug via INTRAVENOUS

## 2024-02-02 MED ORDER — PHENYLEPHRINE 80 MCG/ML (10ML) SYRINGE FOR IV PUSH (FOR BLOOD PRESSURE SUPPORT)
PREFILLED_SYRINGE | INTRAVENOUS | Status: AC
  Filled 2024-02-02: qty 20

## 2024-02-02 MED ORDER — FENTANYL CITRATE (PF) 250 MCG/5ML IJ SOLN
INTRAMUSCULAR | Status: DC | PRN
  Administered 2024-02-02: 100 ug via INTRAVENOUS

## 2024-02-02 MED ORDER — FENTANYL CITRATE (PF) 50 MCG/ML IJ SOSY
50.0000 ug | PREFILLED_SYRINGE | Freq: Once | INTRAMUSCULAR | Status: AC
  Administered 2024-02-02: 50 ug via INTRAVENOUS
  Filled 2024-02-02: qty 1

## 2024-02-02 MED ORDER — DEXAMETHASONE SOD PHOSPHATE PF 10 MG/ML IJ SOLN
INTRAMUSCULAR | Status: DC | PRN
Start: 2024-02-02 — End: 2024-02-02
  Administered 2024-02-02: 5 mg via INTRAVENOUS

## 2024-02-02 MED ORDER — MAGNESIUM SULFATE 2 GM/50ML IV SOLN
2.0000 g | Freq: Once | INTRAVENOUS | Status: DC
  Filled 2024-02-02: qty 50

## 2024-02-02 MED ORDER — LIDOCAINE 2% (20 MG/ML) 5 ML SYRINGE
INTRAMUSCULAR | Status: AC
Start: 2024-02-02 — End: 2024-02-02
  Filled 2024-02-02: qty 15

## 2024-02-02 MED ORDER — LACTATED RINGERS IV SOLN: INTRAVENOUS | Status: DC

## 2024-02-02 MED ORDER — SUGAMMADEX SODIUM 200 MG/2ML IV SOLN
INTRAVENOUS | Status: DC | PRN
  Administered 2024-02-02 (×2): 100 mg via INTRAVENOUS

## 2024-02-02 MED ORDER — TRIMETHOBENZAMIDE HCL 100 MG/ML IM SOLN
200.0000 mg | INTRAMUSCULAR | Status: DC
  Filled 2024-02-02: qty 2

## 2024-02-02 MED ORDER — PROPOFOL 10 MG/ML IV BOLUS
INTRAVENOUS | Status: AC
  Filled 2024-02-02: qty 20

## 2024-02-02 MED ORDER — FENTANYL CITRATE (PF) 50 MCG/ML IJ SOSY
50.0000 ug | PREFILLED_SYRINGE | Freq: Once | INTRAMUSCULAR | Status: DC
  Filled 2024-02-02: qty 1

## 2024-02-02 MED ORDER — MORPHINE SULFATE (PF) 2 MG/ML IV SOLN
1.0000 mg | INTRAVENOUS | Status: DC | PRN
  Administered 2024-02-02 (×3): 2 mg via INTRAVENOUS
  Filled 2024-02-02 (×3): qty 1

## 2024-02-02 MED ORDER — IBUPROFEN 400 MG PO TABS
400.0000 mg | ORAL_TABLET | Freq: Two times a day (BID) | ORAL | Status: DC | PRN
Start: 2024-02-02 — End: 2024-02-02

## 2024-02-02 MED ORDER — EPHEDRINE SULFATE-NACL 50-0.9 MG/10ML-% IV SOSY
PREFILLED_SYRINGE | INTRAVENOUS | Status: DC | PRN
  Administered 2024-02-02 (×2): 5 mg via INTRAVENOUS

## 2024-02-02 MED ORDER — ROCURONIUM BROMIDE 10 MG/ML (PF) SYRINGE
PREFILLED_SYRINGE | INTRAVENOUS | Status: DC | PRN
  Administered 2024-02-02: 50 mg via INTRAVENOUS

## 2024-02-02 MED ORDER — METOPROLOL SUCCINATE ER 25 MG PO TB24: 25.0000 mg | ORAL_TABLET | Freq: Every day | ORAL | Status: DC

## 2024-02-02 MED ORDER — HYDRALAZINE HCL 20 MG/ML IJ SOLN
5.0000 mg | INTRAMUSCULAR | Status: DC | PRN
  Administered 2024-02-02: 5 mg via INTRAVENOUS
  Filled 2024-02-02: qty 1

## 2024-02-02 MED ORDER — ONDANSETRON HCL 4 MG/2ML IJ SOLN
4.0000 mg | Freq: Once | INTRAMUSCULAR | Status: AC
  Administered 2024-02-02: 4 mg via INTRAVENOUS
  Filled 2024-02-02: qty 2

## 2024-02-02 SURGICAL SUPPLY — 59 items
BAG COUNTER SPONGE SURGICOUNT (BAG) ×1 IMPLANT
BIT DRILL 2.4 AO COUPLING CANN (BIT) IMPLANT
BIT DRILL SHORT 2.0 ZI (BIT) IMPLANT
BIT DRILL SHORT 2.5 (BIT) IMPLANT
BNDG COHESIVE 6X5 TAN ST LF (GAUZE/BANDAGES/DRESSINGS) ×1 IMPLANT
BNDG ELASTIC 4X5.8 VLCR STR LF (GAUZE/BANDAGES/DRESSINGS) ×1 IMPLANT
BNDG ELASTIC 6INX 5YD STR LF (GAUZE/BANDAGES/DRESSINGS) ×1 IMPLANT
BNDG ELASTIC 6X10 VLCR STRL LF (GAUZE/BANDAGES/DRESSINGS) IMPLANT
BNDG GAUZE DERMACEA FLUFF 4 (GAUZE/BANDAGES/DRESSINGS) ×2 IMPLANT
COVER SURGICAL LIGHT HANDLE (MISCELLANEOUS) ×1 IMPLANT
DRAPE C-ARM 42X72 X-RAY (DRAPES) IMPLANT
DRAPE C-ARMOR (DRAPES) ×1 IMPLANT
DRAPE U-SHAPE 47X51 STRL (DRAPES) ×1 IMPLANT
ELECTRODE REM PT RTRN 9FT ADLT (ELECTROSURGICAL) ×1 IMPLANT
GAUZE PAD ABD 8X10 STRL (GAUZE/BANDAGES/DRESSINGS) IMPLANT
GAUZE SPONGE 4X4 12PLY STRL (GAUZE/BANDAGES/DRESSINGS) ×1 IMPLANT
GAUZE XEROFORM 5X9 LF (GAUZE/BANDAGES/DRESSINGS) ×1 IMPLANT
GLOVE BIOGEL PI IND STRL 7.5 (GLOVE) ×1 IMPLANT
GLOVE ECLIPSE 7.0 STRL STRAW (GLOVE) ×4 IMPLANT
GLOVE INDICATOR 7.0 STRL GRN (GLOVE) ×1 IMPLANT
GLOVE SURG SYN 7.5 PF PI (GLOVE) ×4 IMPLANT
GOWN STRL SURGICAL XL XLNG (GOWN DISPOSABLE) ×1 IMPLANT
KIT BASIN OR (CUSTOM PROCEDURE TRAY) ×1 IMPLANT
KIT TURNOVER KIT B (KITS) ×1 IMPLANT
KWIRE ALPS MXV 1.6X6 ZI (WIRE) IMPLANT
KWIRE TROC 1.25X150 (WIRE) IMPLANT
NDL 22X1.5 STRL (OR ONLY) (MISCELLANEOUS) IMPLANT
NEEDLE 22X1.5 STRL (OR ONLY) (MISCELLANEOUS) IMPLANT
PACK ORTHO EXTREMITY (CUSTOM PROCEDURE TRAY) ×1 IMPLANT
PAD ARMBOARD POSITIONER FOAM (MISCELLANEOUS) ×2 IMPLANT
PADDING CAST COTTON 6X4 STRL (CAST SUPPLIES) ×3 IMPLANT
PADDING CAST SYNTHETIC 4X4 STR (CAST SUPPLIES) IMPLANT
PLATE LAT FIB 6H LT (Plate) IMPLANT
PUTTY DBM STAGRAFT PLUS 2CC (Putty) IMPLANT
SCREW CANN FT 4.0X38MM (Screw) IMPLANT
SCREW CANN FT 4.0X40 (Screw) IMPLANT
SCREW LOCK MD 2.7X20 (Screw) IMPLANT
SCREW LOCK MDS 2.7X14 (Screw) IMPLANT
SCREW LOCK MDS 2.7X16 (Screw) IMPLANT
SCREW NON-LOCK 3.5X10 (Screw) IMPLANT
SCREW NON-LOCK 3.5X12 (Screw) IMPLANT
SCREW NON-LOCKING 4.0X16 (Screw) IMPLANT
SET HNDPC FAN SPRY TIP SCT (DISPOSABLE) IMPLANT
SOLN 0.9% NACL 1000 ML (IV SOLUTION) ×1 IMPLANT
SOLN 0.9% NACL POUR BTL 1000ML (IV SOLUTION) ×1 IMPLANT
SOLN STERILE WATER 1000 ML (IV SOLUTION) ×2 IMPLANT
SOLN STERILE WATER BTL 1000 ML (IV SOLUTION) ×2 IMPLANT
SPLINT FIBERGLASS 4X30 (CAST SUPPLIES) IMPLANT
SPONGE T-LAP 18X18 ~~LOC~~+RFID (SPONGE) ×1 IMPLANT
STAPLER SKIN PROX 35W (STAPLE) IMPLANT
STOCKINETTE IMPERVIOUS LG (DRAPES) ×1 IMPLANT
SUT ETHILON 2 0 PSLX (SUTURE) IMPLANT
SUT VIC AB 2-0 CT1 TAPERPNT 27 (SUTURE) IMPLANT
SYR CONTROL 10ML LL (SYRINGE) IMPLANT
TOWEL GREEN STERILE (TOWEL DISPOSABLE) ×2 IMPLANT
TOWEL GREEN STERILE FF (TOWEL DISPOSABLE) ×2 IMPLANT
TUBE CONNECTING 12X1/4 (SUCTIONS) ×1 IMPLANT
UNDERPAD 30X36 HEAVY ABSORB (UNDERPADS AND DIAPERS) ×1 IMPLANT
YANKAUER SUCT BULB TIP NO VENT (SUCTIONS) ×1 IMPLANT

## 2024-02-02 NOTE — Transfer of Care (Signed)
 Immediate Anesthesia Transfer of Care Note  Patient: Kristen Allen  Procedure(s) Performed: EXTERNAL FIXATION, ANKLE (Left)  Patient Location: PACU  Anesthesia Type:General  Level of Consciousness: awake, alert , and oriented  Airway & Oxygen Therapy: Patient Spontanous Breathing  Post-op Assessment: Report given to RN and Post -op Vital signs reviewed and stable  Post vital signs: Reviewed and stable  Last Vitals:  Vitals Value Taken Time  BP 146/63 02/02/24 16:45  Temp 36.6 C 02/02/24 16:45  Pulse 78 02/02/24 16:48  Resp 16 02/02/24 16:48  SpO2 91 % 02/02/24 16:48  Vitals shown include unfiled device data.  Last Pain:  Vitals:   02/02/24 1320  TempSrc: Oral  PainSc:          Complications: No notable events documented.

## 2024-02-02 NOTE — Op Note (Addendum)
 Date of Surgery: 02/02/2024  INDICATIONS: Kristen Allen is a 89 y.o.-year-old female who sustained a left ankle fracture; she was indicated for open reduction and internal fixation due to the displaced nature of the articular fracture and came to the operating room today for this procedure. The patient did consent to the procedure after discussion of the risks and benefits.  PREOPERATIVE DIAGNOSIS: Irreducible left trimalleolar ankle fracture dislocation  POSTOPERATIVE DIAGNOSIS: Same.  PROCEDURE: Open treatment of left ankle fracture with internal fixation. Trimalleolar w/o fixation of posterior malleolus CPT 27822.   SURGEON: N. Ozell Cummins, M.D.  ASSIST: Ronal Morna Grave, PA-C  ANESTHESIA:  general, popliteal block  TOURNIQUET TIME: 45 mins  IV FLUIDS AND URINE: See anesthesia.  ESTIMATED BLOOD LOSS: minimal mL.  IMPLANTS: Implant Name Type Inv. Item Serial No. Manufacturer Lot No. LRB No. Used Action  PUTTY DBM STAGRAFT PLUS 2CC - [id number] Putty PUTTY DBM STAGRAFT PLUS 2CC  [employer](ORTH,TRAU,BIO,SG) [id number] Left 1 Implanted    COMPLICATIONS: see description of procedure.  DESCRIPTION OF PROCEDURE: The patient was brought to the operating room and placed supine on the operating table.  The patient had been signed prior to the procedure and this was documented. The patient had the anesthesia placed by the anesthesiologist.  A nonsterile tourniquet was placed on the upper thigh.  The prep verification and incision time-outs were performed to confirm that this was the correct patient, site, side and location. The patient had an SCD on the opposite lower extremity. The patient did receive antibiotics prior to the incision and was re-dosed during the procedure as needed at indicated intervals.  The patient had the lower extremity prepped and draped in the standard surgical fashion.  The extremity was exsanguinated using an esmarch bandage and the tourniquet was inflated to 275  mm Hg.  An incision was made over the lateral aspect of the distal fibula.  Full-thickness flaps were raised off of the fibula.  Subperiosteal elevation was performed.  The fracture was visualized.  There was bone loss and osteoporosis.  Anatomic reduction was not possible.  Bridge plating construct was chosen.  Fracture was brought out to length and alignment and confirmed under fluoroscopy.  Precontoured plate was placed on the fibula and secured with nonlocking and locking screws.  Unicortical locking screws were placed in the distal portion in order to not perforate the joint.  The proximal screws had excellent purchase.  The posterior malleolus was anatomically reduced as a result of the lateral malleolus being reduced.  I then placed 2 percutaneous pins across the medial malleolus using fluoroscopic guidance.  Given the condition of the contused skin and the anatomic reduction of the medial malleolus I felt that percutaneous fixation was appropriate.  Cannulated drill was then advanced over the K wire and 2 fully threaded cannulated screws were placed with the posterior screw being 40 mm and the anterior 1 being 38 mm.  Each screw had good purchase.  The K wires were removed.  Stress exam was then performed of the syndesmosis which showed no widening of the medial clear space with the syndesmosis interval.  Bone graft was then placed in the void in the fibula.  Surgical sites were then thoroughly irrigated with pulsatile lavage and closed in a layered fashion.  Sterile dressings were applied.  Short leg splint was placed.  Patient tolerated procedure well had no immediate complications. Morna Grave, my PA, was a medical necessity for the entirety of the surgery including opening, closing,  limb positioning, retracting, exposing, and repairing.  POSTOPERATIVE PLAN: [name] will remain nonweightbearing on this leg for approximately 6 weeks.  She may platform weight bear to the right upper extremity.   [name] may resume subcutaneous heparin at 12 hours postop for DVT prophylaxis.    GEANNIE Ozell Cummins, MD [facility] 4:11 PM

## 2024-02-02 NOTE — Progress Notes (Signed)
 Initial Nutrition Assessment  DOCUMENTATION CODES:   Not applicable  INTERVENTION:  Once diet advanced following surgery, recommend: Liberalized regular diet to promote adequate intake Ensure Plus High Protein po BID, each supplement provides 350 kcal and 20 grams of protein.  Added nutrition discharge instructions to AVS  NUTRITION DIAGNOSIS:   Increased nutrient needs related to post-op healing as evidenced by estimated needs.  GOAL:   Patient will meet greater than or equal to 90% of their needs  MONITOR:   PO intake, Supplement acceptance, Diet advancement  REASON FOR ASSESSMENT:   Consult Assessment of nutrition requirement/status, Hip fracture protocol  ASSESSMENT:   Pt with hx of arthritis, HTN, cancer, diverticulosis and Barrett's esophagus. Recent fall which required ORIF of R distal radius (12/19/23). Admitted w/ unstable L ankle fx dislocation.  Pt currently in surgery at time of assessment. RD working remotely and unable to reach any family. Unable to obtain diet or wt hx, all information obtained from chart review.  Pt has had multiple reported recent falls, but has still been fairly independent. Pt ambulates well and still drives but currently lives with son who provides assistance. Pt's wt has remained stable still last admission; however, given advanced age and functional decline pt would benefit from nutrition focused physical exam to assess nutrition status.   Recommend liberalized regular diet following surgery to provide increased options to pt and promote adequate intake. Pt would likely benefit from Ensure shakes as well.  Medications reviewed  Labs reviewed   NUTRITION - FOCUSED PHYSICAL EXAM:  Deferred to follow up  Diet Order:   Diet Order             Diet NPO time specified  Diet effective now                   EDUCATION NEEDS:   Not appropriate for education at this time  Skin:     Last BM:  unknown  Height:   Ht  Readings from Last 1 Encounters:  02/02/24 5' 5.98 (1.676 m)    Weight:   Wt Readings from Last 1 Encounters:  02/02/24 61.2 kg    Ideal Body Weight:  59.1 kg  BMI:  Body mass index is 21.8 kg/m.  Estimated Nutritional Needs:   Kcal:  1300-1500  Protein:  60-75g  Fluid:  1.3-1.5L    Josette Glance, MS, RDN, LDN Clinical Dietitian I Please reach out via secure chat

## 2024-02-02 NOTE — ED Triage Notes (Signed)
 Pt BIB [facility] from home. Pt reports she stood up and started to feel dizzy and then fell and injured her left ankle. EMS notes obvious injury to left ankle. PNS intact. No skin broken. No LOC. No blood thinners.   EMS 140/100 BP 98% 66 P

## 2024-02-02 NOTE — Progress Notes (Signed)
 Orthopedic Tech Progress Note Patient Details:  Kristen Allen 05/27/1931 [id number]  Ortho Devices Type of Ortho Device: Stirrup splint, Post (short leg) splint Ortho Device/Splint Interventions: Ordered, Application, Adjustment   Post Interventions Patient Tolerated: Poor Instructions Provided: Care of device Second reduction attempted, and splinted with plaster for a better mold, however patient was very difficult to splint due to a cramp in her RLE and flailing her limbs about due to pain. Splint applied while EDP's molded splint in place. [name] 02/02/2024, 5:55 AM

## 2024-02-02 NOTE — Progress Notes (Signed)
 Orthopedic Tech Progress Note Patient Details:  Kristen Allen [birth year] [id number]  Ortho Devices Type of Ortho Device: Short leg splint, Stirrup splint Ortho Device/Splint Interventions: Ordered, Application, Adjustment   Post Interventions Patient Tolerated: Fair Instructions Provided: Care of device  [name] 02/02/2024, 4:50 AM

## 2024-02-02 NOTE — Anesthesia Preprocedure Evaluation (Signed)
 Anesthesia Evaluation  Patient identified by MRN, date of birth, ID band Patient awake    Reviewed: Allergy & Precautions, NPO status , Patient's Chart, lab work & pertinent test results  History of Anesthesia Complications Negative for: history of anesthetic complications  Airway Mallampati: II  TM Distance: >3 FB Neck ROM: Full    Dental  (+) Teeth Intact, Dental Advisory Given   Pulmonary neg shortness of breath, neg COPD, neg recent URI, former smoker   breath sounds clear to auscultation       Cardiovascular hypertension, Pt. on medications and Pt. on home beta blockers  Rhythm:Regular     Neuro/Psych    GI/Hepatic Neg liver ROS,GERD  ,,  Endo/Other  negative endocrine ROS    Renal/GU negative Renal ROSLab Results      Component                Value               Date                      NA                       138                 02/02/2024                K                        4.1                 02/02/2024                CO2                      26                  02/02/2024                GLUCOSE                  103 (H)             02/02/2024                BUN                      18                  02/02/2024                CREATININE               0.90                02/02/2024                CALCIUM                  9.2                 02/02/2024                GFRNONAA                 >60                 02/02/2024  Musculoskeletal  (+) Arthritis ,   TRIMALLEOLAR FRACTURE, LEFT   Abdominal   Peds  Hematology  (+) Blood dyscrasia, anemia Lab Results      Component                Value               Date                      WBC                      6.5                 02/02/2024                HGB                      11.2 (L)            02/02/2024                HCT                      33.0 (L)            02/02/2024                MCV                      95.5                 02/02/2024                PLT                      272                 02/02/2024              Anesthesia Other Findings   Reproductive/Obstetrics                              Anesthesia Physical Anesthesia Plan  ASA: 2  Anesthesia Plan: MAC and Regional   Post-op Pain Management:    Induction: Intravenous  PONV Risk Score and Plan: 2 and Ondansetron  and Propofol  infusion  Airway Management Planned: Nasal Cannula, Simple Face Mask and Natural Airway  Additional Equipment: None  Intra-op Plan:   Post-operative Plan:   Informed Consent: I have reviewed the patients History and Physical, chart, labs and discussed the procedure including the risks, benefits and alternatives for the proposed anesthesia with the patient or authorized representative who has indicated his/her understanding and acceptance.     Dental advisory given  Plan Discussed with: CRNA  Anesthesia Plan Comments:         Anesthesia Quick Evaluation

## 2024-02-02 NOTE — ED Notes (Signed)
 Patient taken to OR.

## 2024-02-02 NOTE — H&P (Signed)
 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 were negative Reflux Low back pain with right-sided sciatica status post injections by Ortho care Had a right wrist injury that was sustained secondary to outstretched hand mechanical fall seen by Dr. Brutus with Ortho care status post repair 12/19/2023 with ORIF secondary to intra-articular 3+ fragments  She is pretty ambulatory and functional still driving does not smoke or drink lives with her son who helps supplement the history-tells me she has had about 3 falls since July and the last fall that was sustained resulted in ORIF as above Tells me that going from laying to sitting to standing causes her to be dizzy she has not had any changes to her blood pressure medications lately and this is relatively new she has been taking HCTZ as well as metoprolol  at the same doses for several years without any issues Other than that she is pretty high functioning She got out of bed on the night of 10/10 to go to the bathroom as she usually does, had some ambulation difficulty/deficit and her left leg got entangled she fell a little bit backwards and her left leg fell behind the right and she twisted her ankle as per ED history  She experienced severe pain Attempt at short leg splint placement was unsuccessful X2 after giving fentanyl  and orthopedics was consulted-Dr. Cindy will be on his way to probably manipulate under anesthesia  She tells me that after getting the fentanyl  for attempt at reduction she had 1 episode of vomiting and proceeded to vomit once  again clear matter liquid   Review of Systems:  As mentioned above in HPI are pertinent +'s Pertinent negatives as per below   She has not had any cough any cold any chills any fever any shortness of breath recently at home and she takes her medications regularly\ No dark stool no tarry stool in fact usually is somewhat constipated  ED Course: Kept n.p.o. given fentanyl  several rounds-Unasyn started for presumed pneumonia Lidoderm  patch etc. ordered Troponin was 8   Past Medical History:  Diagnosis Date   Abnormal mammogram    Arthritis 04/25/1963   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   Breast lump in female    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   Cough    Diverticulosis 04/24/2006   Esophageal reflux    Hypertension 04/24/2010   Past Surgical History:  Procedure Laterality Date   ABDOMINAL HYSTERECTOMY     APPENDECTOMY     COLON SURGERY     OPEN REDUCTION INTERNAL FIXATION (ORIF) DISTAL RADIAL FRACTURE Right 12/19/2023   Procedure: OPEN REDUCTION INTERNAL FIXATION (ORIF) DISTAL RADIUS FRACTURE;  Surgeon: Arlinda Buster, MD;  Location: [facility];  Service: Orthopedics;  Laterality: Right;    reports that she has quit smoking. Her smoking use included cigarettes. She has a 30 pack-year smoking history. She has never used smokeless tobacco. She reports that she does not drink alcohol and does not use drugs.  Mobility: Usually gets around okay but recently has been a little bit limited with her right hand repair  Allergies  Allergen Reactions   Amlodipine Swelling   Aspirin Other (See Comments)    Vasculitis, thrombocytopenia   Codeine Nausea Only   Erythromycin Rash   History reviewed. No pertinent family history. Prior to Admission medications   Medication Sig Start Date End Date Taking? Authorizing Provider  cetirizine (ZYRTEC) 10 MG tablet Take 10 mg by mouth daily.   Yes [provider]  Cholecalciferol 50 MCG (2000 UT)  CAPS Take 2,000 Units by mouth daily.   Yes [provider]  hydrochlorothiazide  (HYDRODIURIL ) 12.5 MG tablet Take 12.5 mg by mouth daily. 01/18/16  Yes [provider]  ibuprofen  (ADVIL ) 200 MG tablet Take 400-800 mg by mouth 2 (two) times daily as needed for mild pain (pain score 1-3).   Yes [provider]  metoprolol  succinate (TOPROL -XL) 25 MG 24 hr tablet Take 50 mg by mouth daily.   Yes [provider]  vitamin B-12 (CYANOCOBALAMIN) 250 MCG tablet Take 250 mcg by mouth daily.   Yes [provider]  5-Hydroxytryptophan 100 MG CAPS Take by mouth.    [provider]  ondansetron  (ZOFRAN -ODT) 4 MG disintegrating tablet Take 1 tablet (4 mg total) by mouth every 8 (eight) hours as needed for nausea or vomiting. Patient not taking: Reported on 02/02/2024 11/23/23   Tegeler, Lonni PARAS, MD    Physical Exam:  Vitals:   02/02/24 0430 02/02/24 0530  BP: (!) 134/55 (!) 159/69  Pulse: (!) 52 (!) 57  Resp: 16 19  Temp:    SpO2: 99% 100%    Alert pleasant looks much younger than stated age no icterus no pallor Arcus Synolis present CTAB no added sound no wheeze rales rhonchi S1-S2 seems to be in sinus on my exam Abdomen soft nondistended no rebound no guarding Psych euthymic  I have personally reviewed following labs and imaging studies  Medical tests:  EKG independently reviewed: EKG in overview shows sinus bradycardia Sodium 137 potassium 3.9 BUN/creatinine 14/0.9 troponin cycling from 8-7 WBC 6.5 hemoglobin 11 baseline around 12 platelet 272  CT scan of the left foot result pending.  X-ray of the left ankle showed trimalleolar fracture with with posterior dislocation and lateral subluxation of  the ankle joint. Displaced and angulated fracture fragments.Cast or splint in place.  Chest x-ray showing pleural effusion versus airspace disease.  Aspiration did not excluded.  Mild to moderate cardiomegaly.  CT head no acute  intracranial injury.  CT cervical spine cervical spondylosis without cervical canal stenosis.    Active Problems:   * No active hospital problems. *   Assessment/Plan Either orthostasis or BPPV contributing to frequent falls recently Discontinue HCTZ forever 89 year old Continue metoprolol  when able to take p.o. no doses changes as below PRNs will be placed-keep on telemetry--do not think arrhythmia may be causing this but I think she may be bradycardia enough that she is unable to compensate etc. I would like to see her heart rate a little bit higher and She is in sinus bradycardia so I am not convinced she needs an echo although she has possible LVH per criterion on EKG Once she is cleared by orthopedics to ambulate we will probably need to work her up for skilled  Trimalleolar left ankle fracture Per orthopedics Cautious pain control IV Tylenol  Q6 Fentanyl  changed to morphine  1-2 every 4 as needed--Lidoderm  patch every 12 for topical component She had significant nausea with prior pain meds so be careful giving the same  Sinus bradycardia history hypertension Keep on monitors  as discussed above adjust downward dose probably of metoprolol  to 25 XL given concerns of either arrhythmia or bradycardia etc. I have discontinued forever her HCTZ as above  Unfounded concerns for pneumonia She has no white count she has a little bit of fluid on the x-rays I am not convinced she has pneumonia Continue Unasyn today X1 more dose and then get an x-ray in the morning to ensure that what ever the infiltrates are etc. clear  Previous history colon cancer in the 1990s Constipation reflux Resume home meds when able to take all p.o.    Severity of Illness: The appropriate patient status for this patient is INPATIENT. Inpatient status is judged to be reasonable and necessary in order to provide the required intensity of service to ensure the patient's safety. The patient's presenting symptoms,  physical exam findings, and initial radiographic and laboratory data in the context of their chronic comorbidities is felt to place them at high risk for further clinical deterioration. Furthermore, it is not anticipated that the patient will be medically stable for discharge from the hospital within 2 midnights of admission.   * I certify that at the point of admission it is my clinical judgment that the patient will require inpatient hospital care spanning beyond 2 midnights from the point of admission due to high intensity of service, high risk for further deterioration and high frequency of surveillance required.*   Family Communication: Discussed with the son at bedside  DVT ppx: Heparin when able Consults called & Whom: Orthopedics Dr. OLEGARIO you aware  Time spent: 60 minutes  Royal, MD [days-call my NP partners at night for Care related issues] [facility] --Via amion app OR , [email or web address]; password [facility]  02/02/2024, 7:35 AM

## 2024-02-02 NOTE — ED Provider Notes (Addendum)
 [facility] Provider Note   CSN: [id number] Arrival date & time: 02/02/24  9682     Patient presents with: Fall, Foot Injury, and Near Syncope   Kristen Allen is a 89 y.o. female.   The history is provided by the patient and a relative.  Fall This is a new problem. The current episode started less than 1 hour ago. The problem occurs constantly. The problem has been resolved. Pertinent negatives include no chest pain, no abdominal pain, no headaches and no shortness of breath. Nothing aggravates the symptoms. Nothing relieves the symptoms. She has tried nothing for the symptoms. The treatment provided no relief.  Foot Injury Associated symptoms: no fever   Near Syncope Pertinent negatives include no chest pain, no abdominal pain, no headaches and no shortness of breath.  Patient with HTN and GERD presents with lightheadedness and near syncope and ankle deformity.      Prior to Admission medications   Medication Sig Start Date End Date Taking? Authorizing Provider  cetirizine (ZYRTEC) 10 MG tablet Take 10 mg by mouth daily.   Yes [provider]  Cholecalciferol 50 MCG (2000 UT) CAPS Take 2,000 Units by mouth daily.   Yes [provider]  hydrochlorothiazide  (HYDRODIURIL ) 12.5 MG tablet Take 12.5 mg by mouth daily. 01/18/16  Yes [provider]  ibuprofen  (ADVIL ) 200 MG tablet Take 400-800 mg by mouth 2 (two) times daily as needed for mild pain (pain score 1-3).   Yes [provider]  metoprolol  succinate (TOPROL -XL) 25 MG 24 hr tablet Take 50 mg by mouth daily.   Yes [provider]  vitamin B-12 (CYANOCOBALAMIN) 250 MCG tablet Take 250 mcg by mouth daily.   Yes [provider]  5-Hydroxytryptophan 100 MG CAPS Take by mouth.    [provider]  ondansetron  (ZOFRAN -ODT) 4 MG disintegrating tablet Take 1 tablet (4 mg total) by mouth every 8 (eight) hours as needed for nausea or  vomiting. Patient not taking: Reported on 02/02/2024 11/23/23   Tegeler, Lonni PARAS, MD    Allergies: Amlodipine, Aspirin, Codeine, and Erythromycin    Review of Systems  Constitutional:  Negative for fever.  Respiratory:  Negative for shortness of breath.   Cardiovascular:  Positive for near-syncope. Negative for chest pain.  Gastrointestinal:  Negative for abdominal pain.  Musculoskeletal:  Positive for arthralgias.  Neurological:  Negative for headaches.  All other systems reviewed and are negative.   Updated Vital Signs BP (!) 159/69   Pulse (!) 57   Temp 98.8 F (37.1 C) (Oral)   Resp 19   Ht 5' 6 (1.676 m)   Wt 61.2 kg   SpO2 100%   BMI 21.79 kg/m   Physical Exam Vitals and nursing note reviewed.  Constitutional:      General: She is not in acute distress.    Appearance: She is well-developed.  HENT:     Head: Normocephalic and atraumatic.  Eyes:     Pupils: Pupils are equal, round, and reactive to light.  Cardiovascular:     Rate and Rhythm: Normal rate and regular rhythm.     Pulses: Normal pulses.     Heart sounds: Normal heart sounds.  Pulmonary:     Effort: Pulmonary effort is normal. No respiratory distress.     Breath sounds: Normal breath sounds.  Abdominal:     General: Bowel sounds are normal. There is no distension.     Palpations: Abdomen is soft.  Tenderness: There is no abdominal tenderness. There is no guarding or rebound.  Musculoskeletal:        General: Tenderness and deformity present.     Cervical back: Normal range of motion and neck supple.     Left ankle: Swelling and deformity present.     Left Achilles Tendon: Normal.     Left foot: Normal capillary refill.       Legs:  Skin:    General: Skin is warm and dry.     Capillary Refill: Capillary refill takes less than 2 seconds.     Findings: No erythema or rash.  Neurological:     General: No focal deficit present.     Deep Tendon Reflexes: Reflexes normal.  Psychiatric:         Thought Content: Thought content normal.     (all labs ordered are listed, but only abnormal results are displayed) Results for orders placed or performed during the hospital encounter of 02/02/24  Comprehensive metabolic panel   Collection Time: 02/02/24  3:43 AM  Result Value Ref Range   Sodium 137 135 - 145 mmol/L   Potassium 3.9 3.5 - 5.1 mmol/L   Chloride 101 98 - 111 mmol/L   CO2 26 22 - 32 mmol/L   Glucose, Bld 110 (H) 70 - 99 mg/dL   BUN 14 8 - 23 mg/dL   Creatinine, Ser 9.09 0.44 - 1.00 mg/dL   Calcium 9.2 8.9 - 89.6 mg/dL   Total Protein 6.1 (L) 6.5 - 8.1 g/dL   Albumin 3.6 3.5 - 5.0 g/dL   AST 21 15 - 41 U/L   ALT 16 0 - 44 U/L   Alkaline Phosphatase 52 38 - 126 U/L   Total Bilirubin 0.7 0.0 - 1.2 mg/dL   GFR, Estimated >39 >39 mL/min   Anion gap 10 5 - 15  CBC   Collection Time: 02/02/24  3:43 AM  Result Value Ref Range   WBC 6.5 4.0 - 10.5 K/uL   RBC 3.57 (L) 3.87 - 5.11 MIL/uL   Hemoglobin 11.0 (L) 12.0 - 15.0 g/dL   HCT 65.8 (L) 63.9 - 53.9 %   MCV 95.5 80.0 - 100.0 fL   MCH 30.8 26.0 - 34.0 pg   MCHC 32.3 30.0 - 36.0 g/dL   RDW 85.7 88.4 - 84.4 %   Platelets 272 150 - 400 K/uL   nRBC 0.0 0.0 - 0.2 %  Troponin I (High Sensitivity)   Collection Time: 02/02/24  3:49 AM  Result Value Ref Range   Troponin I (High Sensitivity) 8 <18 ng/L  I-stat chem 8, ED (not at [facility], [facility] or [facility])   Collection Time: 02/02/24  4:05 AM  Result Value Ref Range   Sodium 138 135 - 145 mmol/L   Potassium 4.1 3.5 - 5.1 mmol/L   Chloride 100 98 - 111 mmol/L   BUN 18 8 - 23 mg/dL   Creatinine, Ser 9.09 0.44 - 1.00 mg/dL   Glucose, Bld 896 (H) 70 - 99 mg/dL   Calcium, Ion 8.76 8.84 - 1.40 mmol/L   TCO2 27 22 - 32 mmol/L   Hemoglobin 11.2 (L) 12.0 - 15.0 g/dL   HCT 66.9 (L) 63.9 - 53.9 %   CT Cervical Spine Wo Contrast Result Date: 02/02/2024 EXAM: CT CERVICAL SPINE WITHOUT CONTRAST 02/02/2024 05:11:42 AM TECHNIQUE: CT of the cervical spine was performed without the  administration of intravenous contrast. Multiplanar reformatted images are provided for review. Automated exposure control, iterative reconstruction,  and/or weight based adjustment of the mA/kV was utilized to reduce the radiation dose to as low as reasonably achievable. COMPARISON: Head CT 02/02/2024 CLINICAL HISTORY: 89 year old female. Polytrauma, blunt. Fall, denies HA or neck pain. FINDINGS: CERVICAL SPINE: BONES AND ALIGNMENT: No acute fracture or traumatic malalignment. Mild degenerative anterolisthesis of the cervical spine most pronounced at C4-C5. Chronic bilateral posterior element ankylosis at C4-C5. Osteopenia. DEGENERATIVE CHANGES: Advanced cervical facet arthropathy elsewhere, with evidence of developing degenerative facet ankylosis on the right also at C2-C3. Bulky degenerative partially calcified ligamentous hypertrophy about the odontoid. No significant cervical spinal stenosis by CT. SOFT TISSUES: No prevertebral soft tissue swelling. Negative visible noncontrast thoracic inlet. IMPRESSION: 1. No acute cervical spine injury identified. 2. Cervical spondylosis without spinal canal stenosis by CT. Electronically signed by: Helayne Hurst MD 02/02/2024 05:21 AM EDT RP Workstation: [id number]   CT Head Wo Contrast Result Date: 02/02/2024 EXAM: CT HEAD WITHOUT CONTRAST 02/02/2024 05:11:42 AM TECHNIQUE: CT of the head was performed without the administration of intravenous contrast. Automated exposure control, iterative reconstruction, and/or weight based adjustment of the mA/kV was utilized to reduce the radiation dose to as low as reasonably achievable. COMPARISON: None available. CLINICAL HISTORY: 89 year old female. Syncope/presyncope, cerebrovascular cause suspected. Fall, denies HA or neck pain. FINDINGS: BRAIN AND VENTRICLES: No acute hemorrhage. No evidence of acute infarct. No hydrocephalus. No extra-axial collection. No mass effect or midline shift. Normal brain volume per age. Patchy frontal  lobe white matter hypodensity, mild or at most moderate for age. No cortical encephalomalacia. No suspicious intracranial vascular hyperdensity. ORBITS: No acute abnormality. No convincing acute orbital soft tissue injury. SINUSES: No acute abnormality. SOFT TISSUES AND SKULL: No acute soft tissue abnormality. No convincing acute scalp soft tissue injury. No skull fracture. IMPRESSION: 1. No acute traumatic injury identified. 2. Negative for age noncontrast CT appearance of the brain. Electronically signed by: Helayne Hurst MD 02/02/2024 05:17 AM EDT RP Workstation: [id number]   DG Chest Portable 1 View Result Date: 02/02/2024 EXAM: 1 VIEW(S) XRAY OF THE CHEST 02/02/2024 04:16:00 AM COMPARISON: Chest radiographs 01/17/2008 and earlier. CLINICAL HISTORY: 89 year old female with pain and post reduction status. FINDINGS: LUNGS AND PLEURA: Lower lung volumes. Confluent left lung base opacity laterally but much of the left hemidiaphragm remains visible this is indeterminate for left pleural effusion versus airspace disease. No pulmonary edema. No pneumothorax. HEART AND MEDIASTINUM: Mild to moderate cardiomegaly appears new or increased since 2019. Other mediastinal contours within normal limits. BONES AND SOFT TISSUES: No acute osseous abnormality. IMPRESSION: 1. Left basilar opacity, indeterminate for pleural effusion versus airspace disease. And Aspiration not excluded. 2. Mild to moderate cardiomegaly, new/increased since 2019. Electronically signed by: Helayne Hurst MD 02/02/2024 04:31 AM EDT RP Workstation: [id number]   DG Ankle Complete Left Result Date: 02/02/2024 EXAM: 3 VIEW(S) XRAY OF THE LEFT ANKLE 02/02/2024 04:16:00 AM CLINICAL HISTORY: 89 year old female. Pain. Post reduction. COMPARISON: None available. FINDINGS: BONES AND JOINTS: Trimalleolar fracture. Posterior dislocation of the ankle joint. Lateral subluxation of the ankle. Fracture fragments are displaced and angulated. The talus and calcaneus  appear to remain intact. No focal osseous lesion. SOFT TISSUES: Overlying cast/splint material. IMPRESSION: 1. Trimalleolar fracture with posterior dislocation and lateral subluxation of the ankle joint. Displaced and angulated fracture fragments. 2. Cast or splint in place. Electronically signed by: Helayne Hurst MD 02/02/2024 04:28 AM EDT RP Workstation: [id number]   XR Wrist Complete Right Result Date: 01/17/2024 Right wrist x-rays demonstrate stable appearance of  the distal radius hardware fixation, no evidence of hardware failure or migration.  Mild residual ulnar positivity.  XR Wrist Complete Right Result Date: 01/03/2024 Right wrist x-rays demonstrate stable appearance of the distal radius hardware fixation, no evidence of hardware failure or migration.   EKG: None  Radiology: CT Cervical Spine Wo Contrast Result Date: 02/02/2024 EXAM: CT CERVICAL SPINE WITHOUT CONTRAST 02/02/2024 05:11:42 AM TECHNIQUE: CT of the cervical spine was performed without the administration of intravenous contrast. Multiplanar reformatted images are provided for review. Automated exposure control, iterative reconstruction, and/or weight based adjustment of the mA/kV was utilized to reduce the radiation dose to as low as reasonably achievable. COMPARISON: Head CT 02/02/2024 CLINICAL HISTORY: 89 year old female. Polytrauma, blunt. Fall, denies HA or neck pain. FINDINGS: CERVICAL SPINE: BONES AND ALIGNMENT: No acute fracture or traumatic malalignment. Mild degenerative anterolisthesis of the cervical spine most pronounced at C4-C5. Chronic bilateral posterior element ankylosis at C4-C5. Osteopenia. DEGENERATIVE CHANGES: Advanced cervical facet arthropathy elsewhere, with evidence of developing degenerative facet ankylosis on the right also at C2-C3. Bulky degenerative partially calcified ligamentous hypertrophy about the odontoid. No significant cervical spinal stenosis by CT. SOFT TISSUES: No prevertebral soft tissue  swelling. Negative visible noncontrast thoracic inlet. IMPRESSION: 1. No acute cervical spine injury identified. 2. Cervical spondylosis without spinal canal stenosis by CT. Electronically signed by: Helayne Hurst MD 02/02/2024 05:21 AM EDT RP Workstation: [id number]   CT Head Wo Contrast Result Date: 02/02/2024 EXAM: CT HEAD WITHOUT CONTRAST 02/02/2024 05:11:42 AM TECHNIQUE: CT of the head was performed without the administration of intravenous contrast. Automated exposure control, iterative reconstruction, and/or weight based adjustment of the mA/kV was utilized to reduce the radiation dose to as low as reasonably achievable. COMPARISON: None available. CLINICAL HISTORY: 89 year old female. Syncope/presyncope, cerebrovascular cause suspected. Fall, denies HA or neck pain. FINDINGS: BRAIN AND VENTRICLES: No acute hemorrhage. No evidence of acute infarct. No hydrocephalus. No extra-axial collection. No mass effect or midline shift. Normal brain volume per age. Patchy frontal lobe white matter hypodensity, mild or at most moderate for age. No cortical encephalomalacia. No suspicious intracranial vascular hyperdensity. ORBITS: No acute abnormality. No convincing acute orbital soft tissue injury. SINUSES: No acute abnormality. SOFT TISSUES AND SKULL: No acute soft tissue abnormality. No convincing acute scalp soft tissue injury. No skull fracture. IMPRESSION: 1. No acute traumatic injury identified. 2. Negative for age noncontrast CT appearance of the brain. Electronically signed by: Helayne Hurst MD 02/02/2024 05:17 AM EDT RP Workstation: [id number]   DG Chest Portable 1 View Result Date: 02/02/2024 EXAM: 1 VIEW(S) XRAY OF THE CHEST 02/02/2024 04:16:00 AM COMPARISON: Chest radiographs 01/17/2008 and earlier. CLINICAL HISTORY: 89 year old female with pain and post reduction status. FINDINGS: LUNGS AND PLEURA: Lower lung volumes. Confluent left lung base opacity laterally but much of the left hemidiaphragm remains  visible this is indeterminate for left pleural effusion versus airspace disease. No pulmonary edema. No pneumothorax. HEART AND MEDIASTINUM: Mild to moderate cardiomegaly appears new or increased since 2019. Other mediastinal contours within normal limits. BONES AND SOFT TISSUES: No acute osseous abnormality. IMPRESSION: 1. Left basilar opacity, indeterminate for pleural effusion versus airspace disease. And Aspiration not excluded. 2. Mild to moderate cardiomegaly, new/increased since 2019. Electronically signed by: Helayne Hurst MD 02/02/2024 04:31 AM EDT RP Workstation: [id number]   DG Ankle Complete Left Result Date: 02/02/2024 EXAM: 3 VIEW(S) XRAY OF THE LEFT ANKLE 02/02/2024 04:16:00 AM CLINICAL HISTORY: 89 year old female. Pain. Post reduction. COMPARISON: None available. FINDINGS:  BONES AND JOINTS: Trimalleolar fracture. Posterior dislocation of the ankle joint. Lateral subluxation of the ankle. Fracture fragments are displaced and angulated. The talus and calcaneus appear to remain intact. No focal osseous lesion. SOFT TISSUES: Overlying cast/splint material. IMPRESSION: 1. Trimalleolar fracture with posterior dislocation and lateral subluxation of the ankle joint. Displaced and angulated fracture fragments. 2. Cast or splint in place. Electronically signed by: Helayne Hurst MD 02/02/2024 04:28 AM EDT RP Workstation: [id number]     .Reduction of dislocation  Date/Time: 02/02/2024 8:00 AM  Performed by: Nettie Earing, MD Authorized by: Nettie Earing, MD  Consent: Verbal consent obtained Patient identity confirmed: arm band Preparation: Patient was prepped and draped in the usual sterile fashion.  Sedation: Patient sedated: no  Patient tolerance: patient tolerated the procedure well with no immediate complications Comments: Rotation 90 degrees into position. reduced but slipped out of placed x 2       Medications Ordered in the ED  lidocaine  (LIDODERM ) 5 % 1 patch (1 patch Transdermal  Patch Applied 02/02/24 0544)  Ampicillin-Sulbactam (UNASYN) 3 g in sodium chloride  0.9 % 100 mL IVPB (3 g Intravenous New Bag/Given 02/02/24 0645)  fentaNYL  (SUBLIMAZE ) injection 50 mcg (has no administration in time range)  fentaNYL  (SUBLIMAZE ) injection 100 mcg (100 mcg Intravenous Given 02/02/24 0352)  fentaNYL  (SUBLIMAZE ) injection 50 mcg (50 mcg Intravenous Given 02/02/24 0521)  fentaNYL  (SUBLIMAZE ) injection 50 mcg (50 mcg Intravenous Given 02/02/24 0532)  ondansetron  (ZOFRAN ) injection 4 mg (4 mg Intravenous Given 02/02/24 0612)  fentaNYL  (SUBLIMAZE ) injection 50 mcg (50 mcg Intravenous Given 02/02/24 9386)                                    Medical Decision Making Amount and/or Complexity of Data Reviewed External Data Reviewed: notes.    Details: Previous notes reviewed  Labs: ordered.    Details: Normal white count 6.5, low hemoglobin 11, normal platelets. Normal sodium 137, normal potassium 3.9, normal creatinine  Radiology: ordered and independent interpretation performed.    Details: Trimalleolar fracture with subluxation  Discussion of management or test interpretation with external provider(s): Case d/w Dr. Jerri who is aware it is out and subluxed, keep NPO   Risk Prescription drug management. Decision regarding hospitalization.    Final diagnoses:  Near syncope  Closed trimalleolar fracture of left ankle, initial encounter   The patient appears reasonably stabilized for admission considering the current resources, flow, and capabilities available in the ED at this time, and I doubt any other [facility] requiring further screening and/or treatment in the ED prior to admission.     Catrell Morrone, MD 02/02/24 2316

## 2024-02-02 NOTE — Discharge Instructions (Addendum)
        Nutrition Post Hospital Stay Proper nutrition can help your body recover from illness and injury.   Foods and beverages high in protein, vitamins, and minerals help rebuild muscle loss, promote healing, & reduce fall risk.   In addition to eating healthy foods, a nutrition shake is an easy, delicious way to get the nutrition you need during and after your hospital stay  It is recommended that you continue to drink 2 bottles per day of:       Ensure for at least 1 month (30 days) after your hospital stay   Tips for adding a nutrition shake into your routine: As allowed, drink one with vitamins or medications instead of water or juice Enjoy one as a tasty mid-morning or afternoon snack Drink cold or make a milkshake out of it Drink one instead of milk with cereal or snacks Use as a coffee creamer   Available at the following grocery stores and pharmacies:           * [place] [facility] * [employer] * [employer]  * [employer]          * [employer] * [employer]  * [facility]      * Target  * [employer]   * [employer]  * [employer]   * [facility] [phone]            For COUPONS visit: [email or web address]/join or [email or web address]   Suggested Substitutions Ensure Plus = Boost Plus = Carnation Breakfast Essentials = Boost Compact Ensure Active Clear = Boost Breeze Glucerna Shake = Boost Glucose Control = Carnation Breakfast Essentials SUGAR FREE       1. Keep splint clean and dry 2. Elevate foot above level of the heart 4. Take pain meds as needed 5. Strict non weight bearing to operative extremity  Per [facility] clinic policy, our goal is ensure optimal postoperative pain control with a multimodal pain management strategy. For all [facility] patients, our goal is to wean post-operative narcotic medications by 6 weeks post-operatively. If this is not possible due to utilization of pain medication prior to surgery, your [facility] doctor will support your acute  post-operative pain control for the first 6 weeks postoperatively, with a plan to transition you back to your primary pain team following that. [facility] will work to ensure a Therapist, occupational.

## 2024-02-02 NOTE — ED Provider Notes (Signed)
 6:20 AM case d/w Dr. Jerri who is aware it is a trimalleolar fracture and is posteriorly subluxed again despite repeated attempts.  He verbalizes understanding and will see patient   Nettie Earing, MD 02/02/24 0630

## 2024-02-02 NOTE — Hospital Course (Addendum)
  89 year old female past medical history of essential hypertension and GERD presents emergency department from home for a mechanical fall and injured the left ankle.  EMS reported obvious injury of the left ankle without any skin tear.  At presentation to ED patient found borderline hypertensive and eventually blood pressure improved. [name]  Hospitalist has been consulted for further evaluation management of ankle fracture and presyncope.

## 2024-02-02 NOTE — Consult Note (Signed)
 ORTHOPAEDIC CONSULTATION  REQUESTING PHYSICIAN: Samtani, Jai-Gurmukh, MD  Chief Complaint: Left ankle fracture dislocation  HPI: Kristen Allen is a 89 y.o. female who presents with an unstable left ankle fracture dislocation.  EDP attempted reduction 3x last night unsuccessfully.  She is pretty ambulatory and functional still driving does not smoke or drink lives with her son who helps supplement the history-tells me she has had about 3 falls since July and the last fall that was sustained resulted in ORIF as above Tells me that going from laying to sitting to standing causes her to be dizzy she has not had any changes to her blood pressure medications lately and this is relatively new she has been taking HCTZ as well as metoprolol  at the same doses for several years without any issues Other than that she is pretty high functioning She got out of bed on the night of 10/10 to go to the bathroom as she usually does, had some ambulation difficulty/deficit and her left leg got entangled she fell a little bit backwards and her left leg fell behind the right and she twisted her ankle as per ED history  Past Medical History:  Diagnosis Date   Abnormal mammogram    Arthritis 04/25/1963   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   Breast lump in female    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   Cough    Diverticulosis 04/24/2006   Esophageal reflux    HOH (hard of hearing)    wears bilateral hearing aids   Hypertension 04/24/2010   Past Surgical History:  Procedure Laterality Date   ABDOMINAL HYSTERECTOMY     APPENDECTOMY     COLON SURGERY     OPEN REDUCTION INTERNAL FIXATION (ORIF) DISTAL RADIAL FRACTURE Right 12/19/2023   Procedure: OPEN REDUCTION INTERNAL FIXATION (ORIF) DISTAL RADIUS FRACTURE;  Surgeon: Arlinda Buster, MD;  Location: [facility];  Service: Orthopedics;  Laterality: Right;   Social History   Socioeconomic History   Marital status: Widowed     Spouse name: Not on file   Number of children: Not on file   Years of education: Not on file   Highest education level: Not on file  Occupational History   Not on file  Tobacco Use   Smoking status: Former    Current packs/day: 1.00    Average packs/day: 1 pack/day for 30.0 years (30.0 ttl pk-yrs)    Types: Cigarettes   Smokeless tobacco: Never  Substance and Sexual Activity   Alcohol use: No   Drug use: No   Sexual activity: Not on file  Other Topics Concern   Not on file  Social History Narrative   Not on file   Social Drivers of Health   Financial Resource Strain: Low Risk  (06/15/2023)   Received from [facility]   Overall Financial Resource Strain (CARDIA)    Difficulty of Paying Living Expenses: Not hard at all  Food Insecurity: No Food Insecurity (06/15/2023)   Received from [facility]   Hunger Vital Sign    Within the past 12 months, you worried that your food would run out before you got the money to buy more.: Never true    Within the past 12 months, the food you bought just didn't last and you didn't have money to get more.: Never true  Transportation Needs: No Transportation Needs (06/15/2023)   Received from [facility] - Transportation    Lack of Transportation (Medical): No  Lack of Transportation (Non-Medical): No  Physical Activity: Unknown (06/15/2023)   Received from [facility]   Exercise Vital Sign    On average, how many days per week do you engage in moderate to strenuous exercise (like a brisk walk)?: 0 days    Minutes of Exercise per Session: Not on file  Stress: No Stress Concern Present (06/15/2023)   Received from [facility] - Occupational Stress Questionnaire    Feeling of Stress : Not at all  Social Connections: Socially Integrated (06/15/2023)   Received from [facility]   Social Network    How would you rate your social network (family, work, friends)?: Good  participation with social networks   History reviewed. No pertinent family history. - negative except otherwise stated in the family history section Allergies  Allergen Reactions   Amlodipine Swelling   Aspirin Other (See Comments)    Vasculitis, thrombocytopenia   Codeine Nausea Only   Erythromycin Rash   Prior to Admission medications   Medication Sig Start Date End Date Taking? Authorizing Provider  cetirizine (ZYRTEC) 10 MG tablet Take 10 mg by mouth daily.   Yes [provider]  Cholecalciferol 50 MCG (2000 UT) CAPS Take 2,000 Units by mouth daily.   Yes [provider]  hydrochlorothiazide  (HYDRODIURIL ) 12.5 MG tablet Take 12.5 mg by mouth daily. 01/18/16  Yes [provider]  ibuprofen  (ADVIL ) 200 MG tablet Take 400-800 mg by mouth 2 (two) times daily as needed for mild pain (pain score 1-3).   Yes [provider]  metoprolol  succinate (TOPROL -XL) 25 MG 24 hr tablet Take 50 mg by mouth daily.   Yes [provider]  vitamin B-12 (CYANOCOBALAMIN) 250 MCG tablet Take 250 mcg by mouth daily.   Yes [provider]  5-Hydroxytryptophan 100 MG CAPS Take by mouth.    [provider]  ondansetron  (ZOFRAN -ODT) 4 MG disintegrating tablet Take 1 tablet (4 mg total) by mouth every 8 (eight) hours as needed for nausea or vomiting. Patient not taking: Reported on 02/02/2024 11/23/23   Tegeler, Lonni PARAS, MD   CT Ankle Left Wo Contrast Result Date: 02/02/2024 CLINICAL DATA:  Status post reduction EXAM: CT OF THE LEFT ANKLE WITHOUT CONTRAST TECHNIQUE: Multidetector CT imaging of the left ankle was performed according to the standard protocol. Multiplanar CT image reconstructions were also generated. RADIATION DOSE REDUCTION: This exam was performed according to the departmental dose-optimization program which includes automated exposure control, adjustment of the mA and/or kV according to patient size and/or use of iterative  reconstruction technique. COMPARISON:  Left ankle radiograph dated 02/02/2024 FINDINGS: Bones/Joint/Cartilage Comminuted displaced trimalleolar fracture. Comminuted fracture of the distal fibula with 1 full shaft width posterior displacement of the dominant fracture fragment with 1.1 cm foreshortening and mild apex medial angulation. Comminuted fracture of the medial and posterior tibia with disruption of the ankle mortise. The medial fracture fragment is medially displaced by 9 mm relative to the proximal tibia and the posterior fracture fragment is distracted by 1.2 cm. Punctate irregular radiodensities projecting medial to the navicular may represent accessory ossicles. Ligaments Suboptimally assessed by CT. The anterior inferior tibiofibular ligament is disrupted. Muscles and Tendons Grossly intact. Soft tissues Subcutaneous soft tissue stranding, predominantly involving the lateral lower leg and ankle. IMPRESSION: Comminuted displaced trimalleolar fracture with disruption of the ankle mortise. Electronically Signed   By: Limin  Shequilla Goodgame M.D.   On: 02/02/2024 09:22   CT Cervical Spine Wo Contrast Result  Date: 02/02/2024 EXAM: CT CERVICAL SPINE WITHOUT CONTRAST 02/02/2024 05:11:42 AM TECHNIQUE: CT of the cervical spine was performed without the administration of intravenous contrast. Multiplanar reformatted images are provided for review. Automated exposure control, iterative reconstruction, and/or weight based adjustment of the mA/kV was utilized to reduce the radiation dose to as low as reasonably achievable. COMPARISON: Head CT 02/02/2024 CLINICAL HISTORY: 89 year old female. Polytrauma, blunt. Fall, denies HA or neck pain. FINDINGS: CERVICAL SPINE: BONES AND ALIGNMENT: No acute fracture or traumatic malalignment. Mild degenerative anterolisthesis of the cervical spine most pronounced at C4-C5. Chronic bilateral posterior element ankylosis at C4-C5. Osteopenia. DEGENERATIVE CHANGES: Advanced cervical facet  arthropathy elsewhere, with evidence of developing degenerative facet ankylosis on the right also at C2-C3. Bulky degenerative partially calcified ligamentous hypertrophy about the odontoid. No significant cervical spinal stenosis by CT. SOFT TISSUES: No prevertebral soft tissue swelling. Negative visible noncontrast thoracic inlet. IMPRESSION: 1. No acute cervical spine injury identified. 2. Cervical spondylosis without spinal canal stenosis by CT. Electronically signed by: Helayne Hurst MD 02/02/2024 05:21 AM EDT RP Workstation: [id number]   CT Head Wo Contrast Result Date: 02/02/2024 EXAM: CT HEAD WITHOUT CONTRAST 02/02/2024 05:11:42 AM TECHNIQUE: CT of the head was performed without the administration of intravenous contrast. Automated exposure control, iterative reconstruction, and/or weight based adjustment of the mA/kV was utilized to reduce the radiation dose to as low as reasonably achievable. COMPARISON: None available. CLINICAL HISTORY: 89 year old female. Syncope/presyncope, cerebrovascular cause suspected. Fall, denies HA or neck pain. FINDINGS: BRAIN AND VENTRICLES: No acute hemorrhage. No evidence of acute infarct. No hydrocephalus. No extra-axial collection. No mass effect or midline shift. Normal brain volume per age. Patchy frontal lobe white matter hypodensity, mild or at most moderate for age. No cortical encephalomalacia. No suspicious intracranial vascular hyperdensity. ORBITS: No acute abnormality. No convincing acute orbital soft tissue injury. SINUSES: No acute abnormality. SOFT TISSUES AND SKULL: No acute soft tissue abnormality. No convincing acute scalp soft tissue injury. No skull fracture. IMPRESSION: 1. No acute traumatic injury identified. 2. Negative for age noncontrast CT appearance of the brain. Electronically signed by: Helayne Hurst MD 02/02/2024 05:17 AM EDT RP Workstation: [id number]   DG Chest Portable 1 View Result Date: 02/02/2024 EXAM: 1 VIEW(S) XRAY OF THE CHEST  02/02/2024 04:16:00 AM COMPARISON: Chest radiographs 01/17/2008 and earlier. CLINICAL HISTORY: 89 year old female with pain and post reduction status. FINDINGS: LUNGS AND PLEURA: Lower lung volumes. Confluent left lung base opacity laterally but much of the left hemidiaphragm remains visible this is indeterminate for left pleural effusion versus airspace disease. No pulmonary edema. No pneumothorax. HEART AND MEDIASTINUM: Mild to moderate cardiomegaly appears new or increased since 2019. Other mediastinal contours within normal limits. BONES AND SOFT TISSUES: No acute osseous abnormality. IMPRESSION: 1. Left basilar opacity, indeterminate for pleural effusion versus airspace disease. And Aspiration not excluded. 2. Mild to moderate cardiomegaly, new/increased since 2019. Electronically signed by: Helayne Hurst MD 02/02/2024 04:31 AM EDT RP Workstation: [id number]   DG Ankle Complete Left Result Date: 02/02/2024 EXAM: 3 VIEW(S) XRAY OF THE LEFT ANKLE 02/02/2024 04:16:00 AM CLINICAL HISTORY: 89 year old female. Pain. Post reduction. COMPARISON: None available. FINDINGS: BONES AND JOINTS: Trimalleolar fracture. Posterior dislocation of the ankle joint. Lateral subluxation of the ankle. Fracture fragments are displaced and angulated. The talus and calcaneus appear to remain intact. No focal osseous lesion. SOFT TISSUES: Overlying cast/splint material. IMPRESSION: 1. Trimalleolar fracture with posterior dislocation and lateral subluxation of the ankle joint. Displaced and angulated  fracture fragments. 2. Cast or splint in place. Electronically signed by: Helayne Hurst MD 02/02/2024 04:28 AM EDT RP Workstation: [id number]   - pertinent xrays, CT, MRI studies were reviewed and independently interpreted  Positive ROS: All other systems have been reviewed and were otherwise negative with the exception of those mentioned in the HPI and as above.  Physical Exam: General: No acute distress Cardiovascular: No pedal  edema Respiratory: No cyanosis, no use of accessory musculature GI: No organomegaly, abdomen is soft and non-tender Skin: No lesions in the area of chief complaint Neurologic: Sensation intact distally Psychiatric: Patient is at baseline mood and affect Lymphatic: No axillary or cervical lymphadenopathy  MUSCULOSKELETAL:  LLE splint in place Toes exposed, wiggling without pain Normal cap refill  Assessment: Left trimalleolar ankle fx dislocation  Plan: Detailed discussion with patient and daughter on injury and treatment options of ex fix vs. ORIF based on soft tissue status.  R/B/A to surgery discussed.  Consent obtained  Thank you for the consult and the opportunity to see Ms. Kristen Allen Ozell Cummins, MD [facility] 1:37 PM

## 2024-02-02 NOTE — Anesthesia Procedure Notes (Signed)
 Procedure Name: Intubation Date/Time: 02/02/2024 3:11 PM  Performed by: Valen Gillison C., CRNAPre-anesthesia Checklist: Patient identified, Emergency Drugs available, Suction available, Patient being monitored and Timeout performed Patient Re-evaluated:Patient Re-evaluated prior to induction Oxygen Delivery Method: Circle system utilized Preoxygenation: Pre-oxygenation with 100% oxygen Induction Type: IV induction Ventilation: Mask ventilation without difficulty Laryngoscope Size: Miller and 2 Grade View: Grade I Tube type: Oral Tube size: 7.0 mm Number of attempts: 1 Airway Equipment and Method: Stylet Placement Confirmation: ETT inserted through vocal cords under direct vision, positive ETCO2 and breath sounds checked- equal and bilateral Secured at: 22 cm Tube secured with: Tape Dental Injury: Teeth and Oropharynx as per pre-operative assessment

## 2024-02-03 DIAGNOSIS — S82852A Displaced trimalleolar fracture of left lower leg, initial encounter for closed fracture: Secondary | ICD-10-CM | POA: Diagnosis not present

## 2024-02-03 LAB — CBC
HCT: 29.5 % — ABNORMAL LOW (ref 36.0–46.0)
Hemoglobin: 9.8 g/dL — ABNORMAL LOW (ref 12.0–15.0)
MCH: 31.2 pg (ref 26.0–34.0)
MCHC: 33.2 g/dL (ref 30.0–36.0)
MCV: 93.9 fL (ref 80.0–100.0)
Platelets: 194 K/uL (ref 150–400)
RBC: 3.14 MIL/uL — ABNORMAL LOW (ref 3.87–5.11)
RDW: 14.6 % (ref 11.5–15.5)
WBC: 7.9 K/uL (ref 4.0–10.5)
nRBC: 0 % (ref 0.0–0.2)

## 2024-02-03 LAB — URINALYSIS, ROUTINE W REFLEX MICROSCOPIC
Bilirubin Urine: NEGATIVE
Glucose, UA: NEGATIVE mg/dL
Hgb urine dipstick: NEGATIVE
Ketones, ur: NEGATIVE mg/dL
Leukocytes,Ua: NEGATIVE
Nitrite: NEGATIVE
Protein, ur: NEGATIVE mg/dL
Specific Gravity, Urine: 1.006 (ref 1.005–1.030)
pH: 6 (ref 5.0–8.0)

## 2024-02-03 LAB — BASIC METABOLIC PANEL WITH GFR
Anion gap: 11 (ref 5–15)
BUN: 15 mg/dL (ref 8–23)
CO2: 22 mmol/L (ref 22–32)
Calcium: 8.8 mg/dL — ABNORMAL LOW (ref 8.9–10.3)
Chloride: 101 mmol/L (ref 98–111)
Creatinine, Ser: 0.83 mg/dL (ref 0.44–1.00)
GFR, Estimated: >60 mL/min (ref >60)
Glucose, Bld: 149 mg/dL — ABNORMAL HIGH (ref 70–99)
Potassium: 4.7 mmol/L (ref 3.5–5.1)
Sodium: 134 mmol/L — ABNORMAL LOW (ref 135–145)

## 2024-02-03 LAB — GLUCOSE, CAPILLARY: Glucose-Capillary: 125 mg/dL — ABNORMAL HIGH (ref 70–99)

## 2024-02-03 MED ORDER — ENOXAPARIN SODIUM 40 MG/0.4ML IJ SOSY: 40.0000 mg | PREFILLED_SYRINGE | INTRAMUSCULAR | 0 refills | Status: AC

## 2024-02-03 MED ORDER — MENTHOL 3 MG MT LOZG
1.0000 | LOZENGE | OROMUCOSAL | Status: DC | PRN
  Administered 2024-02-03: 3 mg via ORAL
  Filled 2024-02-03: qty 9

## 2024-02-03 MED ORDER — MORPHINE SULFATE (PF) 4 MG/ML IV SOLN: 4.0000 mg | INTRAVENOUS | Status: DC | PRN

## 2024-02-03 MED ORDER — HYDROCODONE-ACETAMINOPHEN 5-325 MG PO TABS: 1.0000 | ORAL_TABLET | Freq: Three times a day (TID) | ORAL | 0 refills | Status: AC | PRN

## 2024-02-03 NOTE — Progress Notes (Signed)
 Transition of Care [facility]) - CAGE-AID Screening   Patient Details  Name: Kristen Allen MRN: [id number] Date of Birth: 11-Jun-1931  [facility] of [facility]) CM/SW Contact:    Bernardino Mayotte, RN Phone Number: 02/03/2024, 11:36 PM   Clinical Narrative:  Patient denies the use of alcohol and illicit drugs. Resources not given at this time.  CAGE-AID Screening:    Have You Ever Felt You Ought to Cut Down on Your Drinking or Drug Use?: No Have People Annoyed You By Critizing Your Drinking Or Drug Use?: No Have You Felt Bad Or Guilty About Your Drinking Or Drug Use?: No Have You Ever Had a Drink or Used Drugs First Thing In The Morning to Steady Your Nerves or to Get Rid of a Hangover?: No CAGE-AID Score: 0  Substance Abuse Education Offered: No

## 2024-02-03 NOTE — Progress Notes (Signed)
 PROGRESS NOTE  Kristen Allen FMW:[id number] DOB: 1931-07-25 DOA: 02/02/2024 PCP: Abran Jon CROME, MD  HPI/Recap of past 57 hours: 89 year old female with medical history significant for colon cancer s/p resection in the 90s, GERD, right-sided sciatica, recent right wrist injury s/p repair on 12/19/2023 presented to the ED due to a possible mechanical fall resulting in twisting her ankle.  Patient complained of severe pain and was brought to the ED.  In the ED, BP was somewhat uncontrolled otherwise stable.  Labs fairly unremarkable.  CT head/cervical spine unremarkable, CT left ankle showed comminuted displaced trimalleolar fracture with disruption of the ankle.  Orthopedics consulted.  Patient admitted for further management.   Today, patient denies any new complaints.  Had some suprapubic tenderness, of which she related to having difficulty emptying her bladder while on the bed.  Requested UA/UC.    Assessment/Plan: Principal Problem:   Left trimalleolar fracture, closed, initial encounter   Likely orthostasis contributing to recurrent falls Recurrent falls resulting to fractures Possibly orthostatic from still taking HCTZ as an elderly patient Discontinue hydrochlorothiazide  Unable to do orthostatic vitals due to nonweightbearing Fall precautions   Trimalleolar left ankle fracture s/p ORIF on 02/02/2024 Management per orthopedics Cautious pain control PT/OT   Possible UTI Reports suprapubic pressure, not retaining UA/UCx pending collection  Hypertension BP somewhat stable Continue metoprolol , discontinue hydrochlorothiazide   Normocytic anemia Possible acute blood loss from recent surgery Anemia panel pending Daily CBC    Malnutrition Type:  Nutrition Problem: Increased nutrient needs Etiology: post-op healing   Malnutrition Characteristics:  Signs/Symptoms: estimated needs   Nutrition Interventions:  Interventions: Ensure Enlive (each supplement  provides 350kcal and 20 grams of protein)    Estimated body mass index is 21.8 kg/m as calculated from the following:   Height as of this encounter: 5' 5.98 (1.676 m).   Weight as of this encounter: 61.2 kg.     Code Status: DNR  Family Communication: None at bedside  Disposition Plan: Status is: Inpatient Remains inpatient appropriate because: Level of care      Consultants: Orthopedics  Procedures: ORIF on 10/11  Antimicrobials: None  DVT prophylaxis: Heparin Hatch   Objective: Vitals:   02/02/24 1958 02/03/24 0425 02/03/24 0734 02/03/24 0941  BP: 133/69 119/60 (!) 164/129 (!) 158/61  Pulse: 71 74 80 88  Resp:  18 16   Temp: 98.2 F (36.8 C) 97.9 F (36.6 C) (!) 97.3 F (36.3 C)   TempSrc: Oral Oral    SpO2: 97% 97% 99%   Weight:      Height:        Intake/Output Summary (Last 24 hours) at 02/03/2024 1227 Last data filed at 02/02/2024 2238 Gross per 24 hour  Intake 1416.4 ml  Output 25 ml  Net 1391.4 ml   Filed Weights   02/02/24 0325 02/02/24 1306  Weight: 61.2 kg 61.2 kg    Exam: General: NAD  Cardiovascular: S1, S2 present Respiratory: CTAB Abdomen: Soft, nontender, nondistended, bowel sounds present Musculoskeletal: No bilateral pedal edema noted, L ankle dressing intact Skin: Normal Psychiatry: Normal mood     Data Reviewed: CBC: Recent Labs  Lab 02/02/24 0343 02/02/24 0405 02/03/24 0150  WBC 6.5  --  7.9  HGB 11.0* 11.2* 9.8*  HCT 34.1* 33.0* 29.5*  MCV 95.5  --  93.9  PLT 272  --  194   Basic Metabolic Panel: Recent Labs  Lab 02/02/24 0343 02/02/24 0405 02/03/24 0150  NA 137 138 134*  K 3.9 4.1 4.7  CL 101 100 101  CO2 26  --  22  GLUCOSE 110* 103* 149*  BUN 14 18 15   CREATININE 0.90 0.90 0.83  CALCIUM 9.2  --  8.8*   GFR: Estimated Creatinine Clearance: 42.2 mL/min (by C-G formula based on SCr of 0.83 mg/dL). Liver Function Tests: Recent Labs  Lab 02/02/24 0343  AST 21  ALT 16  ALKPHOS 52  BILITOT 0.7   PROT 6.1*  ALBUMIN 3.6   No results for input(s): LIPASE, AMYLASE in the last 168 hours. No results for input(s): AMMONIA in the last 168 hours. Coagulation Profile: No results for input(s): INR, PROTIME in the last 168 hours. Cardiac Enzymes: No results for input(s): CKTOTAL, CKMB, CKMBINDEX, TROPONINI in the last 168 hours. BNP (last 3 results) No results for input(s): PROBNP in the last 8760 hours. HbA1C: No results for input(s): HGBA1C in the last 72 hours. CBG: Recent Labs  Lab 02/03/24 0637  GLUCAP 125*   Lipid Profile: No results for input(s): CHOL, HDL, LDLCALC, TRIG, CHOLHDL, LDLDIRECT in the last 72 hours. Thyroid Function Tests: No results for input(s): TSH, T4TOTAL, FREET4, T3FREE, THYROIDAB in the last 72 hours. Anemia Panel: No results for input(s): VITAMINB12, FOLATE, FERRITIN, TIBC, IRON, RETICCTPCT in the last 72 hours. Urine analysis:    Component Value Date/Time   COLORURINE YELLOW 08/18/2022 0610   APPEARANCEUR CLEAR 08/18/2022 0610   LABSPEC 1.010 08/18/2022 0610   PHURINE 7.0 08/18/2022 0610   GLUCOSEU NEGATIVE 08/18/2022 0610   HGBUR NEGATIVE 08/18/2022 0610   BILIRUBINUR NEGATIVE 08/18/2022 0610   KETONESUR NEGATIVE 08/18/2022 0610   PROTEINUR NEGATIVE 08/18/2022 0610   UROBILINOGEN 0.2 10/03/2010 0127   NITRITE NEGATIVE 08/18/2022 0610   LEUKOCYTESUR NEGATIVE 08/18/2022 0610   Sepsis Labs: @LABRCNTIP (procalcitonin:4,lacticidven:4)  ) Recent Results (from the past 240 hours)  Surgical pcr screen     Status: None   Collection Time: 02/02/24  9:12 AM   Specimen: Nasal Mucosa; Nasal Swab  Result Value Ref Range Status   MRSA, PCR NEGATIVE NEGATIVE Final   Staphylococcus aureus NEGATIVE NEGATIVE Final    Comment: (NOTE) The Xpert SA Assay (FDA approved for NASAL specimens in patients 58 years of age and older), is one component of a comprehensive surveillance program. It is not  intended to diagnose infection nor to guide or monitor treatment. Performed at [facility], [address] [place], [place] [address]       Studies: DG Ankle Complete Left Result Date: 02/02/2024 CLINICAL DATA:  Known ankle fracture EXAM: LEFT ANKLE COMPLETE - 3+ VIEW COMPARISON:  Films from earlier in the same day FLUOROSCOPY TIME:  Radiation Exposure Index (as provided by the fluoroscopic device): 0.2 mGy If the device does not provide the exposure index: Fluoroscopy Time:  43 seconds Number of Acquired Images:  4 FINDINGS: Initial images again demonstrate distal tibial and fibular fractures. Fixation sideplate was then placed along the distal fibula. Fixation screws are also noted traversing the medial malleolus. Fracture fragments are in near anatomic alignment. IMPRESSION: ORIF of distal tibial and fibular fractures. Electronically Signed   By: Oneil Devonshire M.D.   On: 02/02/2024 20:03   DG C-Arm 1-60 Min-No Report Result Date: 02/02/2024 Fluoroscopy was utilized by the requesting physician.  No radiographic interpretation.    Scheduled Meds:  heparin  5,000 Units Subcutaneous Q8H   lidocaine   1 patch Transdermal Q24H   metoprolol  succinate  25 mg Oral Daily    Continuous Infusions:   LOS: 1 day  Lebron JINNY Cage, MD [facility]  If 7PM-7AM, please contact night-coverage [email or web address] 02/03/2024, 12:27 PM

## 2024-02-03 NOTE — Plan of Care (Signed)
  Problem: Pain Managment: Goal: General experience of comfort will improve and/or be controlled Outcome: Progressing   Problem: Safety: Goal: Ability to remain free from injury will improve Outcome: Progressing

## 2024-02-03 NOTE — TOC Initial Note (Signed)
 Transition of Care [facility]) - Initial/Assessment Note    Patient Details  Name: Kristen Allen MRN: [id number] Date of Birth: [birth year]  Transition of Care [facility]) CM/SW Contact:    Cena Ligas, LCSW Phone Number: 02/03/2024, 12:42 PM  Clinical Narrative:                  CSW received SNF consult. CSW met with pt at bedside. CSWs son and daughter in law were on the phone and another daughter joined by phone. CSW introduced self and explained role at the hospital. Pt reports that PTA she lives at home with her son. She reports she was independent with mobility and ADL's and was still driving.   CSW reviewed PT/OT recommendations for SNF. Pt reports she is ok with going temporarily. Pt gave CSW permission to fax out to facilities in the area. Pt and children prefer [facility]. CSW gave pt medicare.gov rating list to review. CSW explained insurance auth process.  CSW will continue to follow.   Expected Discharge Plan: Skilled Nursing Facility Barriers to Discharge: Continued Medical Work up   Patient Goals and CMS Choice Patient states their goals for this hospitalization and ongoing recovery are:: To go to rehab CMS Medicare.gov Compare Post Acute Care list provided to:: Patient Represenative (must comment) Choice offered to / list presented to : Patient, Spouse, Adult Children      Expected Discharge Plan and Services       Living arrangements for the past 2 months: Single Family Home                                      Prior Living Arrangements/Services Living arrangements for the past 2 months: Single Family Home Lives with:: Self Patient language and need for interpreter reviewed:: Yes Do you feel safe going back to the place where you live?: Yes      Need for Family Participation in Patient Care: Yes (Comment) Care giver support system in place?: Yes (comment)   Criminal Activity/Legal Involvement Pertinent to Current Situation/Hospitalization: No -  Comment as needed  Activities of Daily Living   ADL Screening (condition at time of admission) Independently performs ADLs?: No Does the patient have a NEW difficulty with bathing/dressing/toileting/self-feeding that is expected to last >3 days?: Yes (Initiates electronic notice to provider for possible OT consult) Does the patient have a NEW difficulty with getting in/out of bed, walking, or climbing stairs that is expected to last >3 days?: Yes (Initiates electronic notice to provider for possible PT consult) Does the patient have a NEW difficulty with communication that is expected to last >3 days?: No Is the patient deaf or have difficulty hearing?: Yes Does the patient have difficulty seeing, even when wearing glasses/contacts?: No Does the patient have difficulty concentrating, remembering, or making decisions?: No  Permission Sought/Granted Permission sought to share information with : Facility Industrial/product designer granted to share information with : Yes, Verbal Permission Granted     Permission granted to share info w AGENCY: SNF        Emotional Assessment Appearance:: Appears stated age, Appears younger than stated age Attitude/Demeanor/Rapport: Ambitious, Engaged, Self-Confident Affect (typically observed): Accepting, Appropriate Orientation: : Oriented to Self, Oriented to Place, Oriented to  Time, Oriented to Situation Alcohol / Substance Use: Not Applicable Psych Involvement: No (comment)  Admission diagnosis:  Hip abrasion [S70.219A] Near syncope [R55] Closed trimalleolar fracture of  left ankle, initial encounter [S82.852A] Patient Active Problem List   Diagnosis Date Noted   Left trimalleolar fracture, closed, initial encounter 02/02/2024   Closed fracture of right distal radius 12/19/2023   PCP:  Abran Jon CROME, MD Pharmacy:   [facility] [place], [place] - [address] [place] [address] Phone: [phone]  Fax: [phone]  [facility] [place] [facility] [address], [address] [place] [address] Phone: [phone] Fax: [phone]     Social Drivers of Health (SDOH) Social History: SDOH Screenings   Food Insecurity: No Food Insecurity (02/02/2024)  Housing: Low Risk  (02/02/2024)  Transportation Needs: No Transportation Needs (02/02/2024)  Utilities: Not At Risk (02/02/2024)  Financial Resource Strain: Low Risk  (06/15/2023)   Received from [facility]  Physical Activity: Unknown (06/15/2023)   Received from [facility]  Social Connections: Moderately Integrated (02/02/2024)  Stress: No Stress Concern Present (06/15/2023)   Received from [facility]  Tobacco Use: Medium Risk (02/02/2024)   SDOH Interventions:     Readmission Risk Interventions     No data to display

## 2024-02-03 NOTE — Progress Notes (Signed)
 VAST consult. Secure chat with Kristin, RN. Reports no need for PIV at this time. Consult completed. Powell Bowler, RN VAST

## 2024-02-03 NOTE — Progress Notes (Addendum)
 Subjective: 1 Day Post-Op Procedure(s) (LRB): EXTERNAL FIXATION, ANKLE (Left) Patient reports pain as mild.    Objective: Vital signs in last 24 hours: Temp:  [97.3 F (36.3 C)-98.6 F (37 C)] 97.3 F (36.3 C) (10/12 0734) Pulse Rate:  [56-88] 88 (10/12 0941) Resp:  [12-20] 16 (10/12 0734) BP: (109-164)/(51-129) 158/61 (10/12 0941) SpO2:  [92 %-100 %] 99 % (10/12 0734) Weight:  [61.2 kg] 61.2 kg (10/11 1306)  Intake/Output from previous day: 10/11 0701 - 10/12 0700 In: 1416.4 [P.O.:120; I.V.:1000; IV Piggyback:296.4] Out: 25 [Blood:25] Intake/Output this shift: No intake/output data recorded.  Recent Labs    02/02/24 0343 02/02/24 0405 02/03/24 0150  HGB 11.0* 11.2* 9.8*   Recent Labs    02/02/24 0343 02/02/24 0405 02/03/24 0150  WBC 6.5  --  7.9  RBC 3.57*  --  3.14*  HCT 34.1* 33.0* 29.5*  PLT 272  --  194   Recent Labs    02/02/24 0343 02/02/24 0405 02/03/24 0150  NA 137 138 134*  K 3.9 4.1 4.7  CL 101 100 101  CO2 26  --  22  BUN 14 18 15   CREATININE 0.90 0.90 0.83  GLUCOSE 110* 103* 149*  CALCIUM 9.2  --  8.8*   No results for input(s): LABPT, INR in the last 72 hours.  Neurologically intact Sensation intact distally Well-fitting splint in place.  Able to wiggle toes.  Good sensation to toes.   Assessment/Plan: 1 Day Post-Op Procedure(s) (LRB): EXTERNAL FIXATION, ANKLE (Left) Up with therapy NWB LLE Platform weight bearing RUE May resume subcutaneous heparin and will d/c with lovenox Norco for d/c pain control F/u with ortho 2 weeks post-op D/c dispo per primary team.  Lives at home with working son.       Ronal LITTIE Grave 02/03/2024, 9:50 AM

## 2024-02-03 NOTE — Evaluation (Signed)
 Physical Therapy Evaluation Patient Details Name: Kristen Allen MRN: [id number] DOB: 1931-09-02 Today's Date: 02/03/2024  History of Present Illness  Kristen Allen is a 89 y.o. female admitted 02/02/24 after fall c/f syncope. Pt sustained an unstable left ankle fracture dislocation. ED attempted reduction 3x last night unsuccessfully. Pt s/p ORIF 10/11. PMHx: HTN, diverticulosis, breast cancer, and GERD. Of note, pt fell 8/1 sustaining closed fx of distal radius and wrist on RUE.   Clinical Impression  Pt admitted with above diagnosis. PTA, pt was independent with functional mobility, ADLs, and driving. She lives with her son in a one story house. Pt currently with functional limitations due to the deficits listed below (see PT Problem List). She required minA for bed mobility and modA for squat pivot transfers. Pt is currently limited by pain, weight-bearing status, impaired balance, and decreased activity tolerance. Pt will benefit from acute skilled PT to increase her independence and safety with mobility to allow discharge. Recommend continued inpatient follow up therapy, <3 hours/day.    If plan is discharge home, recommend the following: Two people to help with walking and/or transfers;A lot of help with bathing/dressing/bathroom;Assistance with cooking/housework;Assist for transportation;Help with stairs or ramp for entrance   Can travel by private vehicle   No    Equipment Recommendations BSC/3in1  Recommendations for Other Services       Functional Status Assessment Patient has had a recent decline in their functional status and demonstrates the ability to make significant improvements in function in a reasonable and predictable amount of time.     Precautions / Restrictions Precautions Precautions: Fall Recall of Precautions/Restrictions: Intact Required Braces or Orthoses: Splint/Cast Splint/Cast: LLE Splint/Cast - Date Prophylactic Dressing Applied (if applicable):  02/02/24 Restrictions Weight Bearing Restrictions Per Provider Order: Yes RUE Weight Bearing Per Provider Order: Partial weight bearing RUE Partial Weight Bearing Percentage or Pounds: Platform weight bearing LLE Weight Bearing Per Provider Order: Non weight bearing      Mobility  Bed Mobility Overal bed mobility: Needs Assistance Bed Mobility: Supine to Sit, Sit to Supine     Supine to sit: Min assist, HOB elevated, Used rails Sit to supine: Min assist, HOB elevated   General bed mobility comments: Pt requested to sit up on L side of bed as she is left-hand dominant. Cues for sequencing. Assist to manage LLE. Cues to adhere to weight-bearing restrictions. Scooted pt fwd with use of bed pad. Returning to bed pt controlled trunk and had assist to bring BLE back into bed.    Transfers Overall transfer level: Needs assistance Equipment used: None (R platform RW not available) Transfers: Bed to chair/wheelchair/BSC       Squat pivot transfers: Mod assist     General transfer comment: Demonstrated proper sequencing and cued technique. Pt performed bed<>recliner chair and bed<>BSC. She opted to rest L foot on PT's shoe to help limit weight acceptance. Cues to only put weight through R elbow. PT safely guided pt between surfaces.    Ambulation/Gait                  Stairs            Wheelchair Mobility     Tilt Bed    Modified Rankin (Stroke Patients Only)       Balance Overall balance assessment: Needs assistance Sitting-balance support: Single extremity supported, Feet supported Sitting balance-Leahy Scale: Fair     Standing balance support: During functional activity, Single extremity supported Standing balance-Leahy Scale: Poor  Standing balance comment: Pt dependent on exteral support of therapist                             Pertinent Vitals/Pain Pain Assessment Pain Assessment: 0-10 Pain Score: 5  Pain Location: L ankle Pain  Descriptors / Indicators: Operative site guarding, Throbbing, Discomfort, Aching Pain Intervention(s): Monitored during session, Limited activity within patient's tolerance, Repositioned    Home Living Family/patient expects to be discharged to:: Private residence Living Arrangements: Children (Son) Available Help at Discharge: Family;Available PRN/intermittently Type of Home: House Home Access: Stairs to enter Entrance Stairs-Rails: None Entrance Stairs-Number of Steps: 1 (threshold)   Home Layout: One level Home Equipment: Agricultural consultant (2 wheels);Cane - single point;Shower seat;Grab bars - tub/shower;Wheelchair - manual      Prior Function Prior Level of Function : Independent/Modified Independent;Driving;History of Falls (last six months)             Mobility Comments: Ambulates without AD. 1 fall leading to current admission. ADLs Comments: Indep with ADLs. Drives some. Retired.     Extremity/Trunk Assessment   Upper Extremity Assessment Upper Extremity Assessment: Defer to OT evaluation    Lower Extremity Assessment Lower Extremity Assessment: LLE deficits/detail LLE Deficits / Details: Pt POD 1 s/p L ankle ORIF. Pt in short leg splint spanning ankle joint going to her inferior knee. Hip and Knee AROM WFL. Decreased strength d/t added weight of splint. Grossly 3/5. Unable to assess ankle. LLE: Unable to fully assess due to pain;Unable to fully assess due to immobilization LLE Sensation: decreased proprioception LLE Coordination: decreased gross motor    Cervical / Trunk Assessment Cervical / Trunk Assessment: Normal  Communication   Communication Communication: No apparent difficulties    Cognition Arousal: Alert Behavior During Therapy: WFL for tasks assessed/performed   PT - Cognitive impairments: No apparent impairments                       PT - Cognition Comments: Pt A,Ox4 Following commands: Intact       Cueing Cueing Techniques:  Verbal cues, Gestural cues, Visual cues     General Comments      Exercises     Assessment/Plan    PT Assessment Patient needs continued PT services  PT Problem List Decreased strength;Decreased activity tolerance;Decreased balance;Decreased mobility;Decreased knowledge of use of DME;Decreased safety awareness;Decreased knowledge of precautions;Pain       PT Treatment Interventions DME instruction;Gait training;Stair training;Functional mobility training;Therapeutic activities;Therapeutic exercise;Balance training;Patient/family education;Wheelchair mobility training    PT Goals (Current goals can be found in the Care Plan section)  Acute Rehab PT Goals Patient Stated Goal: Regain independence and return home PT Goal Formulation: With patient/family Time For Goal Achievement: 02/17/24 Potential to Achieve Goals: Good    Frequency Min 2X/week     Co-evaluation               AM-PAC PT 6 Clicks Mobility  Outcome Measure Help needed turning from your back to your side while in a flat bed without using bedrails?: A Little Help needed moving from lying on your back to sitting on the side of a flat bed without using bedrails?: A Little Help needed moving to and from a bed to a chair (including a wheelchair)?: A Lot Help needed standing up from a chair using your arms (e.g., wheelchair or bedside chair)?: A Lot Help needed to walk in hospital room?: Total Help needed climbing  3-5 steps with a railing? : Total 6 Click Score: 12    End of Session Equipment Utilized During Treatment: Gait belt Activity Tolerance: Patient tolerated treatment well Patient left: in bed;with call bell/phone within reach;with bed alarm set;with family/visitor present Nurse Communication: Mobility status PT Visit Diagnosis: Difficulty in walking, not elsewhere classified (R26.2);Other abnormalities of gait and mobility (R26.89);Unsteadiness on feet (R26.81);Pain Pain - Right/Left: Left Pain -  part of body: Ankle and joints of foot;Leg    Time: 8381-8349 PT Time Calculation (min) (ACUTE ONLY): 32 min   Charges:   PT Evaluation $PT Eval Moderate Complexity: 1 Mod   PT General Charges $$ ACUTE PT VISIT: 1 Visit         Randall SAUNDERS, PT, DPT Acute Rehabilitation Services Office: [phone] Secure Chat Preferred  Kristen Allen 02/03/2024, 5:24 PM

## 2024-02-04 ENCOUNTER — Encounter (HOSPITAL_COMMUNITY): Payer: Self-pay | Admitting: Orthopaedic Surgery

## 2024-02-04 DIAGNOSIS — S82852A Displaced trimalleolar fracture of left lower leg, initial encounter for closed fracture: Secondary | ICD-10-CM | POA: Diagnosis not present

## 2024-02-04 LAB — CBC WITH DIFFERENTIAL/PLATELET
Abs Immature Granulocytes: 0.03 K/uL (ref 0.00–0.07)
Basophils Absolute: 0.1 K/uL (ref 0.0–0.1)
Basophils Relative: 1 %
Eosinophils Absolute: 0.3 K/uL (ref 0.0–0.5)
Eosinophils Relative: 3 %
HCT: 31 % — ABNORMAL LOW (ref 36.0–46.0)
Hemoglobin: 10.2 g/dL — ABNORMAL LOW (ref 12.0–15.0)
Immature Granulocytes: 0 %
Lymphocytes Relative: 22 %
Lymphs Abs: 1.7 K/uL (ref 0.7–4.0)
MCH: 31.5 pg (ref 26.0–34.0)
MCHC: 32.9 g/dL (ref 30.0–36.0)
MCV: 95.7 fL (ref 80.0–100.0)
Monocytes Absolute: 0.8 K/uL (ref 0.1–1.0)
Monocytes Relative: 10 %
Neutro Abs: 5 K/uL (ref 1.7–7.7)
Neutrophils Relative %: 64 %
Platelets: 237 K/uL (ref 150–400)
RBC: 3.24 MIL/uL — ABNORMAL LOW (ref 3.87–5.11)
RDW: 15 % (ref 11.5–15.5)
WBC: 7.8 K/uL (ref 4.0–10.5)
nRBC: 0 % (ref 0.0–0.2)

## 2024-02-04 LAB — URINE CULTURE: Culture: NO GROWTH

## 2024-02-04 LAB — BASIC METABOLIC PANEL WITH GFR
Anion gap: 11 (ref 5–15)
BUN: 12 mg/dL (ref 8–23)
CO2: 24 mmol/L (ref 22–32)
Calcium: 8.9 mg/dL (ref 8.9–10.3)
Chloride: 103 mmol/L (ref 98–111)
Creatinine, Ser: 0.75 mg/dL (ref 0.44–1.00)
GFR, Estimated: >60 mL/min (ref >60)
Glucose, Bld: 101 mg/dL — ABNORMAL HIGH (ref 70–99)
Potassium: 4.1 mmol/L (ref 3.5–5.1)
Sodium: 138 mmol/L (ref 135–145)

## 2024-02-04 MED ORDER — METHOCARBAMOL 500 MG PO TABS
500.0000 mg | ORAL_TABLET | Freq: Three times a day (TID) | ORAL | Status: DC | PRN
Start: 2024-02-04 — End: 2024-02-05
  Administered 2024-02-04 (×2): 500 mg via ORAL
  Filled 2024-02-04 (×2): qty 1

## 2024-02-04 NOTE — Progress Notes (Signed)
 PROGRESS NOTE  Kristen Allen FMW:[id number] DOB: 1931/06/04 DOA: 02/02/2024 PCP: Abran Jon CROME, MD  HPI/Recap of past 79 hours: 89 year old female with medical history significant for colon cancer s/p resection in the 90s, GERD, right-sided sciatica, recent right wrist injury s/p repair on 12/19/2023 presented to the ED due to a possible mechanical fall resulting in twisting her ankle.  Patient complained of severe pain and was brought to the ED.  In the ED, BP was somewhat uncontrolled otherwise stable.  Labs fairly unremarkable.  CT head/cervical spine unremarkable, CT left ankle showed comminuted displaced trimalleolar fracture with disruption of the ankle.  Orthopedics consulted.  Patient admitted for further management.   Today, patient denies any new complaints.  Discussed extensively with patient and friend at bedside, all questions answered.    Assessment/Plan: Principal Problem:   Left trimalleolar fracture, closed, initial encounter   Likely orthostasis contributing to recurrent falls Recurrent falls resulting to fractures Possibly orthostatic from taking HCTZ as an elderly patient Discontinue hydrochlorothiazide  Unable to do orthostatic vitals due to nonweightbearing Fall precautions   Trimalleolar left ankle fracture s/p ORIF on 02/02/2024 Management per orthopedics Cautious pain control PT/OT   Possible UTI Reports suprapubic pressure, not retaining UA unremarkable, UCx pending  Hypertension BP somewhat stable Continue metoprolol , discontinue hydrochlorothiazide   Normocytic anemia Possible acute blood loss from recent surgery Daily CBC    Malnutrition Type:  Nutrition Problem: Increased nutrient needs Etiology: post-op healing   Malnutrition Characteristics:  Signs/Symptoms: estimated needs   Nutrition Interventions:  Interventions: Ensure Enlive (each supplement provides 350kcal and 20 grams of protein)    Estimated body mass index is 21.8  kg/m as calculated from the following:   Height as of this encounter: 5' 5.98 (1.676 m).   Weight as of this encounter: 61.2 kg.     Code Status: DNR  Family Communication: None at bedside  Disposition Plan: Status is: Inpatient Remains inpatient appropriate because: Level of care      Consultants: Orthopedics  Procedures: ORIF on 10/11  Antimicrobials: None  DVT prophylaxis: Heparin [place]   Objective: Vitals:   02/03/24 2100 02/04/24 0543 02/04/24 0758 02/04/24 1425  BP: 123/68 (!) 152/69 (!) 157/75 (!) 141/75  Pulse: 67 67 69 62  Resp: 18 18 17 18   Temp: 97.9 F (36.6 C) 98.2 F (36.8 C) 98.2 F (36.8 C) 98.6 F (37 C)  TempSrc: Oral Oral Tympanic Oral  SpO2: 96% 97% 98% 97%  Weight:      Height:        Intake/Output Summary (Last 24 hours) at 02/04/2024 1641 Last data filed at 02/04/2024 1300 Gross per 24 hour  Intake 840 ml  Output 1400 ml  Net -560 ml   Filed Weights   02/02/24 0325 02/02/24 1306  Weight: 61.2 kg 61.2 kg    Exam: General: NAD  Cardiovascular: S1, S2 present Respiratory: CTAB Abdomen: Soft, nontender, nondistended, bowel sounds present Musculoskeletal: No bilateral pedal edema noted, L ankle dressing intact Skin: Normal Psychiatry: Normal mood     Data Reviewed: CBC: Recent Labs  Lab 02/02/24 0343 02/02/24 0405 02/03/24 0150 02/04/24 0605  WBC 6.5  --  7.9 7.8  NEUTROABS  --   --   --  5.0  HGB 11.0* 11.2* 9.8* 10.2*  HCT 34.1* 33.0* 29.5* 31.0*  MCV 95.5  --  93.9 95.7  PLT 272  --  194 237   Basic Metabolic Panel: Recent Labs  Lab 02/02/24 0343 02/02/24 0405 02/03/24 0150 02/04/24  0605  NA 137 138 134* 138  K 3.9 4.1 4.7 4.1  CL 101 100 101 103  CO2 26  --  22 24  GLUCOSE 110* 103* 149* 101*  BUN 14 18 15 12   CREATININE 0.90 0.90 0.83 0.75  CALCIUM 9.2  --  8.8* 8.9   GFR: Estimated Creatinine Clearance: 43.8 mL/min (by C-G formula based on SCr of 0.75 mg/dL). Liver Function Tests: Recent Labs   Lab 02/02/24 0343  AST 21  ALT 16  ALKPHOS 52  BILITOT 0.7  PROT 6.1*  ALBUMIN 3.6   No results for input(s): LIPASE, AMYLASE in the last 168 hours. No results for input(s): AMMONIA in the last 168 hours. Coagulation Profile: No results for input(s): INR, PROTIME in the last 168 hours. Cardiac Enzymes: No results for input(s): CKTOTAL, CKMB, CKMBINDEX, TROPONINI in the last 168 hours. BNP (last 3 results) No results for input(s): PROBNP in the last 8760 hours. HbA1C: No results for input(s): HGBA1C in the last 72 hours. CBG: Recent Labs  Lab 02/03/24 0637  GLUCAP 125*   Lipid Profile: No results for input(s): CHOL, HDL, LDLCALC, TRIG, CHOLHDL, LDLDIRECT in the last 72 hours. Thyroid Function Tests: No results for input(s): TSH, T4TOTAL, FREET4, T3FREE, THYROIDAB in the last 72 hours. Anemia Panel: No results for input(s): VITAMINB12, FOLATE, FERRITIN, TIBC, IRON, RETICCTPCT in the last 72 hours. Urine analysis:    Component Value Date/Time   COLORURINE STRAW (A) 02/03/2024 0717   APPEARANCEUR CLEAR 02/03/2024 0717   LABSPEC 1.006 02/03/2024 0717   PHURINE 6.0 02/03/2024 0717   GLUCOSEU NEGATIVE 02/03/2024 0717   HGBUR NEGATIVE 02/03/2024 0717   BILIRUBINUR NEGATIVE 02/03/2024 0717   KETONESUR NEGATIVE 02/03/2024 0717   PROTEINUR NEGATIVE 02/03/2024 0717   UROBILINOGEN 0.2 10/03/2010 0127   NITRITE NEGATIVE 02/03/2024 0717   LEUKOCYTESUR NEGATIVE 02/03/2024 0717   Sepsis Labs: @LABRCNTIP (procalcitonin:4,lacticidven:4)  ) Recent Results (from the past 240 hours)  Surgical pcr screen     Status: None   Collection Time: 02/02/24  9:12 AM   Specimen: Nasal Mucosa; Nasal Swab  Result Value Ref Range Status   MRSA, PCR NEGATIVE NEGATIVE Final   Staphylococcus aureus NEGATIVE NEGATIVE Final    Comment: (NOTE) The Xpert SA Assay (FDA approved for NASAL specimens in patients 72 years of age and older), is one  component of a comprehensive surveillance program. It is not intended to diagnose infection nor to guide or monitor treatment. Performed at [facility], [address], [place], [place] [address]       Studies: No results found.   Scheduled Meds:  heparin  5,000 Units Subcutaneous Q8H   lidocaine   1 patch Transdermal Q24H   metoprolol  succinate  25 mg Oral Daily    Continuous Infusions:   LOS: 2 days     Alix Stowers J Moshe Wenger, MD [facility]  If 7PM-7AM, please contact night-coverage [email or web address] 02/04/2024, 4:41 PM

## 2024-02-04 NOTE — TOC Progression Note (Addendum)
 Transition of Care [facility]) - Progression Note    Patient Details  Name: Kristen Allen MRN: [id number] Date of Birth: 1931-07-12  [facility] of [facility] CM/SW Contact  Bridget Cordella Simmonds, LCSW Phone Number: 02/04/2024, 10:51 AM  Clinical Narrative:   JANAS completed, referral sent out in hub for SNF.  1200: bed offers provided to pt.  She will accept offer at [facility].  [facility] informed.   Medicare payer with inpt order on 02/02/24.  Expected Discharge Plan: Skilled Nursing Facility Barriers to Discharge: Continued Medical Work up               Expected Discharge Plan and Services       Living arrangements for the past 2 months: Single Family Home                                       Social Drivers of Health (SDOH) Interventions SDOH Screenings   Food Insecurity: No Food Insecurity (02/02/2024)  Housing: Low Risk  (02/02/2024)  Transportation Needs: No Transportation Needs (02/02/2024)  Utilities: Not At Risk (02/02/2024)  Financial Resource Strain: Low Risk  (06/15/2023)   Received from [facility]  Physical Activity: Unknown (06/15/2023)   Received from [facility]  Social Connections: Moderately Integrated (02/02/2024)  Stress: No Stress Concern Present (06/15/2023)   Received from [facility]  Tobacco Use: Medium Risk (02/02/2024)    Readmission Risk Interventions     No data to display

## 2024-02-04 NOTE — Evaluation (Signed)
 Occupational Therapy Evaluation Patient Details Name: Kristen Allen MRN: [id number] DOB: 05-04-1931 Today's Date: 02/04/2024   History of Present Illness   Kristen Allen is a 89 y.o. female admitted 02/02/24 after fall c/f syncope. Pt sustained an unstable left ankle fracture dislocation. ED attempted reduction 3x last night unsuccessfully. Pt s/p ORIF 10/11. PMHx: HTN, diverticulosis, breast cancer, and GERD. Of note, pt fell 8/1 sustaining closed fx of distal radius and wrist on RUE.     Clinical Impressions PTA Pt was independent with all ADL/IADL tasks. Pt did not use DME for functional transfers but reports she notices she has been wobbly as of recently. Pt required Mod A for squat pivot transfer this date and verbal cues for maintaining weight bearing precautions. Pt is currently requiring up to Max A for ADL tasks. Pt is primarily limited by decreased activity tolerance, UE and LE weight bearing precautions, decrease functional transfer abilities, and pain. OT will continue to follow acutely to facilitate progress towards goals. Patient will benefit from continued inpatient follow up therapy, <3 hours/day.      If plan is discharge home, recommend the following:   A lot of help with walking and/or transfers;A lot of help with bathing/dressing/bathroom;Assistance with cooking/housework;Assist for transportation;Help with stairs or ramp for entrance     Functional Status Assessment   Patient has had a recent decline in their functional status and demonstrates the ability to make significant improvements in function in a reasonable and predictable amount of time.     Equipment Recommendations   None recommended by OT     Recommendations for Other Services         Precautions/Restrictions   Precautions Precautions: Fall Recall of Precautions/Restrictions: Intact Required Braces or Orthoses: Splint/Cast Splint/Cast: LLE; RUE wrsit cock up Splint/Cast - Date  Prophylactic Dressing Applied (if applicable): 02/02/24 Restrictions Weight Bearing Restrictions Per Provider Order: Yes RUE Weight Bearing Per Provider Order: Partial weight bearing RUE Partial Weight Bearing Percentage or Pounds: Platform LLE Weight Bearing Per Provider Order: Non weight bearing     Mobility Bed Mobility Overal bed mobility: Needs Assistance Bed Mobility: Supine to Sit     Supine to sit: Contact guard, HOB elevated, Used rails     General bed mobility comments: Pt sat up on L side of the bed. She required increased time and CGA for supine to sit. Pt required verbal cues and CGA for forward scoots.    Transfers Overall transfer level: Needs assistance Equipment used: None (Platform walker not available) Transfers: Bed to chair/wheelchair/BSC     Squat pivot transfers: Mod assist       General transfer comment: Pt required explicit verbal instruction for sequence of squat pivot transfer, increased multimodal cueing for L hand placement as she would try to hold onto therapist. Pt able to maintain WB precautions throughout transfer and responsive to prompts.      Balance Overall balance assessment: Needs assistance Sitting-balance support: Single extremity supported, Feet supported Sitting balance-Leahy Scale: Fair Sitting balance - Comments: Pt maintained static sitting EOB   Standing balance support: Single extremity supported, During functional activity Standing balance-Leahy Scale: Poor Standing balance comment: Pt required physical support of therapist                           ADL either performed or assessed with clinical judgement   ADL Overall ADL's : Needs assistance/impaired Eating/Feeding: Set up   Grooming: Set up;Sitting  Upper Body Bathing: Minimal assistance;Sitting   Lower Body Bathing: Moderate assistance;Sitting/lateral leans   Upper Body Dressing : Set up;Sitting   Lower Body Dressing: Maximal  assistance;Sitting/lateral leans   Toilet Transfer: Moderate assistance;Squat-pivot;BSC/3in1   Toileting- Clothing Manipulation and Hygiene: Moderate assistance;Sitting/lateral lean       Functional mobility during ADLs: Moderate assistance       Vision   Vision Assessment?: No apparent visual deficits     Perception         Praxis         Pertinent Vitals/Pain Pain Assessment Pain Assessment: Faces Faces Pain Scale: Hurts a little bit Pain Descriptors / Indicators: Discomfort, Other (Comment) (noticeable, hot) Pain Intervention(s): Monitored during session     Extremity/Trunk Assessment Upper Extremity Assessment Upper Extremity Assessment: RUE deficits/detail;Left hand dominant RUE Deficits / Details: closed fx of distal radius and wrist (8/1); Pt wearing wrist cock up splint; partial weight bearing platform. RUE Sensation:  (Pt endorses minimal impairment of sensation of thumb but that it is going back to normal. Light touch and proprioception WNL.) RUE Coordination: WNL   Lower Extremity Assessment Lower Extremity Assessment: Defer to PT evaluation;LLE deficits/detail LLE Deficits / Details: s/p L ankle ORIF. Pt in short leg splint spanning ankle joint going to her inferior knee.   Cervical / Trunk Assessment Cervical / Trunk Assessment: Normal   Communication Communication Communication: No apparent difficulties   Cognition Arousal: Alert Behavior During Therapy: WFL for tasks assessed/performed Cognition: No apparent impairments                               Following commands: Intact       Cueing  General Comments   Cueing Techniques: Verbal cues;Gestural cues;Visual cues  Pt educated on rehabilitative process and plan of care.   Exercises     Shoulder Instructions      Home Living Family/patient expects to be discharged to:: Private residence Living Arrangements: Children (Son) Available Help at Discharge: Family;Available  PRN/intermittently (Home after work) Type of Home: House Home Access: Stairs to enter [employer] of Steps: 1 (threshold)   Home Layout: One level     Bathroom Shower/Tub: Producer, television/film/video: Standard     Home Equipment: Agricultural consultant (2 wheels);Cane - single point;Shower seat;Grab bars - tub/shower;Wheelchair - manual          Prior Functioning/Environment Prior Level of Function : Independent/Modified Independent;Driving;History of Falls (last six months) (noticed she wobbles when she walks)             Mobility Comments: Ambulates without AD ADLs Comments: Indep with ADLs. Drives some. Retired.    OT Problem List: Decreased strength;Decreased activity tolerance;Impaired balance (sitting and/or standing);Decreased knowledge of use of DME or AE;Decreased knowledge of precautions;Impaired UE functional use;Pain   OT Treatment/Interventions: Self-care/ADL training;Therapeutic exercise;DME and/or AE instruction;Therapeutic activities;Patient/family education;Balance training      OT Goals(Current goals can be found in the care plan section)   Acute Rehab OT Goals Patient Stated Goal: To go to rehab OT Goal Formulation: With patient Time For Goal Achievement: 02/18/24 Potential to Achieve Goals: Good ADL Goals Pt Will Perform Lower Body Bathing: with contact guard assist;with adaptive equipment;sitting/lateral leans Pt Will Perform Lower Body Dressing: with min assist;with adaptive equipment;sitting/lateral leans Pt Will Transfer to Toilet: with contact guard assist (platform walker) Additional ADL Goal #1: Pt will engage in lateral scoots with supervision   OT  Frequency:  Min 2X/week    Co-evaluation              AM-PAC OT 6 Clicks Daily Activity     Outcome Measure Help from another person eating meals?: A Little Help from another person taking care of personal grooming?: A Little Help from another person toileting, which  includes using toliet, bedpan, or urinal?: A Lot Help from another person bathing (including washing, rinsing, drying)?: A Lot Help from another person to put on and taking off regular upper body clothing?: A Little Help from another person to put on and taking off regular lower body clothing?: A Lot 6 Click Score: 15   End of Session Equipment Utilized During Treatment: Gait belt Nurse Communication: Mobility status  Activity Tolerance: Patient tolerated treatment well;No increased pain Patient left: in chair;with call bell/phone within reach;with nursing/sitter in room  OT Visit Diagnosis: Unsteadiness on feet (R26.81);Muscle weakness (generalized) (M62.81);History of falling (Z91.81);Pain Pain - Right/Left: Left Pain - part of body: Leg                Time: 1213-1232 OT Time Calculation (min): 19 min Charges:  OT General Charges $OT Visit: 1 Visit OT Evaluation $OT Eval Moderate Complexity: 1 Mod  Kristen Allen, OTR/L.  [facility]  Office: [phone]   Kristen Allen 02/04/2024, 1:54 PM

## 2024-02-04 NOTE — Plan of Care (Signed)
  Problem: Education: Goal: Knowledge of General Education information will improve Description: Including pain rating scale, medication(s)/side effects and non-pharmacologic comfort measures Outcome: Progressing   Problem: Clinical Measurements: Goal: Ability to maintain clinical measurements within normal limits will improve Outcome: Progressing   Problem: Activity: Goal: Risk for activity intolerance will decrease Outcome: Progressing   Problem: Elimination: Goal: Will not experience complications related to bowel motility Outcome: Progressing Goal: Will not experience complications related to urinary retention Outcome: Progressing   Problem: Pain Managment: Goal: General experience of comfort will improve and/or be controlled Outcome: Progressing   Problem: Safety: Goal: Ability to remain free from injury will improve Outcome: Progressing   Problem: Skin Integrity: Goal: Risk for impaired skin integrity will decrease Outcome: Progressing

## 2024-02-04 NOTE — NC FL2 (Signed)
 [place]  MEDICAID FL2 LEVEL OF CARE FORM     IDENTIFICATION  Patient Name: Kristen Allen Birthdate: Jul 23, 1931 Sex: female Admission Date (Current Location): 02/02/2024  [facility] and [place] Number:  Producer, television/film/video and Address:  [facility], [address], [place], [place] [address]      Provider Number: [id number]  Attending Physician Name and Address:  Donnamarie Lebron PARAS, MD  Relative Name and Phone Number:       Current Level of Care: Hospital Recommended Level of Care: Skilled Nursing Facility Prior Approval Number:    Date Approved/Denied:   PASRR Number: [id number]  Discharge Plan: SNF    Current Diagnoses: Patient Active Problem List   Diagnosis Date Noted   Left trimalleolar fracture, closed, initial encounter 02/02/2024   Closed fracture of right distal radius 12/19/2023    Orientation RESPIRATION BLADDER Height & Weight     Self, Time, Situation, Place  Normal   Weight: 135 lb (61.2 kg) Height:  5' 5.98 (167.6 cm)  BEHAVIORAL SYMPTOMS/MOOD NEUROLOGICAL BOWEL NUTRITION STATUS        Diet (see discharge summary)  AMBULATORY STATUS COMMUNICATION OF NEEDS Skin   Total Care Verbally Surgical wounds                       Personal Care Assistance Level of Assistance  Bathing, Feeding, Dressing Bathing Assistance: Limited assistance Feeding assistance: Limited assistance Dressing Assistance: Limited assistance     Functional Limitations Info             SPECIAL CARE FACTORS FREQUENCY  PT (By licensed PT), OT (By licensed OT)     PT Frequency: 5x week OT Frequency: 5x week            Contractures Contractures Info: Not present    Additional Factors Info  Code Status, Allergies Code Status Info: DNR Allergies Info: Amlodipine, Aspirin, Codeine, Erythromycin           Current Medications (02/04/2024):  This is the current hospital active medication list Current Facility-Administered  Medications  Medication Dose Route Frequency Provider Last Rate Last Admin   heparin injection 5,000 Units  5,000 Units Subcutaneous Q8H Jerri Kay HERO, MD   5,000 Units at 02/04/24 0539   hydrALAZINE (APRESOLINE) injection 5 mg  5 mg Intravenous Q4H PRN Jerri Kay HERO, MD   5 mg at 02/02/24 0933   HYDROcodone-acetaminophen  (NORCO/VICODIN) 5-325 MG per tablet 1-2 tablet  1-2 tablet Oral Q6H PRN Jerri Kay HERO, MD   2 tablet at 02/04/24 9171   lidocaine  (LIDODERM ) 5 % 1 patch  1 patch Transdermal Q24H Jerri Kay HERO, MD   1 patch at 02/03/24 0542   menthol (CEPACOL) lozenge 3 mg  1 lozenge Oral PRN Ezenduka, Nkeiruka J, MD   3 mg at 02/03/24 1708   methocarbamol (ROBAXIN) injection 500 mg  500 mg Intravenous Q8H PRN Jerri Kay HERO, MD   500 mg at 02/03/24 1805   methocarbamol (ROBAXIN) tablet 500 mg  500 mg Oral Q8H PRN Daniels, James K, NP   500 mg at 02/04/24 0207   metoprolol  succinate (TOPROL -XL) 24 hr tablet 25 mg  25 mg Oral Daily Jerri Kay HERO, MD   25 mg at 02/04/24 9171   morphine  (PF) 4 MG/ML injection 4 mg  4 mg Intravenous Q2H PRN Ezenduka, Nkeiruka J, MD       ondansetron  (ZOFRAN ) injection 4 mg  4 mg Intravenous Q6H PRN  Jerri Kay HERO, MD   4 mg at 02/02/24 0818   Oral care mouth rinse  15 mL Mouth Rinse PRN Samtani, Jai-Gurmukh, MD       senna-docusate (Senokot-S) tablet 1 tablet  1 tablet Oral QHS PRN Jerri Kay HERO, MD         Discharge Medications: Please see discharge summary for a list of discharge medications.  Relevant Imaging Results:  Relevant Lab Results:   Additional Information SSN: [id number]  Bridget Cordella Simmonds, LCSW

## 2024-02-04 NOTE — Progress Notes (Signed)
   Subjective:  Patient reports pain as mild.    Today's  total administered Morphine  Milligram Equivalents: 10  Objective:   VITALS:   Vitals:   02/03/24 1436 02/03/24 2100 02/04/24 0543 02/04/24 0758  BP: (!) 135/56 123/68 (!) 152/69 (!) 157/75  Pulse: 83 67 67 69  Resp: 16 18 18 17   Temp: 97.9 F (36.6 C) 97.9 F (36.6 C) 98.2 F (36.8 C) 98.2 F (36.8 C)  TempSrc: Oral Oral Oral Tympanic  SpO2: 97% 96% 97% 98%  Weight:      Height:        Sensation intact distally Intact pulses distally Splint intact   Lab Results  Component Value Date   WBC 7.8 02/04/2024   HGB 10.2 (L) 02/04/2024   HCT 31.0 (L) 02/04/2024   MCV 95.7 02/04/2024   PLT 237 02/04/2024     Assessment/Plan:  2 Days Post-Op   - Expected postop acute blood loss anemia - Up with PT/OT - DVT ppx - SCDs, ambulation, lovenox - NWB operative extremity - Pain control - Discharge planning - SNF tomorrow  Kristen Allen 02/04/2024, 1:09 PM

## 2024-02-05 ENCOUNTER — Encounter: Admitting: Rehabilitative and Restorative Service Providers"

## 2024-02-05 DIAGNOSIS — S82852A Displaced trimalleolar fracture of left lower leg, initial encounter for closed fracture: Secondary | ICD-10-CM | POA: Diagnosis not present

## 2024-02-05 LAB — CBC WITH DIFFERENTIAL/PLATELET
Abs Immature Granulocytes: 0.02 K/uL (ref 0.00–0.07)
Basophils Absolute: 0 K/uL (ref 0.0–0.1)
Basophils Relative: 1 %
Eosinophils Absolute: 0.3 K/uL (ref 0.0–0.5)
Eosinophils Relative: 4 %
HCT: 26.9 % — ABNORMAL LOW (ref 36.0–46.0)
Hemoglobin: 8.8 g/dL — ABNORMAL LOW (ref 12.0–15.0)
Immature Granulocytes: 0 %
Lymphocytes Relative: 27 %
Lymphs Abs: 1.6 K/uL (ref 0.7–4.0)
MCH: 31.2 pg (ref 26.0–34.0)
MCHC: 32.7 g/dL (ref 30.0–36.0)
MCV: 95.4 fL (ref 80.0–100.0)
Monocytes Absolute: 0.7 K/uL (ref 0.1–1.0)
Monocytes Relative: 12 %
Neutro Abs: 3.4 K/uL (ref 1.7–7.7)
Neutrophils Relative %: 56 %
Platelets: 201 K/uL (ref 150–400)
RBC: 2.82 MIL/uL — ABNORMAL LOW (ref 3.87–5.11)
RDW: 14.6 % (ref 11.5–15.5)
WBC: 6 K/uL (ref 4.0–10.5)
nRBC: 0 % (ref 0.0–0.2)

## 2024-02-05 LAB — VITAMIN B12: Vitamin B-12: 978 pg/mL — ABNORMAL HIGH (ref 180–914)

## 2024-02-05 LAB — IRON AND TIBC
Iron: 50 ug/dL (ref 28–170)
Saturation Ratios: 17 % (ref 10.4–31.8)
TIBC: 302 ug/dL (ref 250–450)
UIBC: 252 ug/dL

## 2024-02-05 LAB — FERRITIN: Ferritin: 62 ng/mL (ref 11–307)

## 2024-02-05 LAB — FOLATE: Folate: >20 ng/mL (ref >5.9)

## 2024-02-05 MED ORDER — HYDRALAZINE HCL 20 MG/ML IJ SOLN: 5.0000 mg | Freq: Three times a day (TID) | INTRAMUSCULAR | Status: DC | PRN

## 2024-02-05 MED ORDER — LIDOCAINE 5 % EX PTCH: 1.0000 | MEDICATED_PATCH | CUTANEOUS | Status: AC

## 2024-02-05 MED ORDER — POLYETHYLENE GLYCOL 3350 17 G PO PACK: 17.0000 g | PACK | Freq: Every day | ORAL | Status: AC | PRN

## 2024-02-05 NOTE — TOC Transition Note (Signed)
 Transition of Care [facility]) - Discharge Note   Patient Details  Name: Kristen Allen MRN: [id number] Date of Birth: [birth year]  [facility] CM/SW Contact:  Bridget Cordella Simmonds, LCSW Phone Number: 02/05/2024, 12:39 PM   Clinical Narrative:   Pt discharging to [facility].  RN call report to [phone].  PTAR called [phone].     Final next level of care: Skilled Nursing Facility Barriers to Discharge: Barriers Resolved   Patient Goals and CMS Choice Patient states their goals for this hospitalization and ongoing recovery are:: To go to rehab CMS Medicare.gov Compare Post Acute Care list provided to:: Patient Represenative (must comment) Choice offered to / list presented to : Patient, Spouse, Adult Children      Discharge Placement              Patient chooses bed at: [facility] Patient to be transferred to facility by: ptar Name of family member notified: daughter Bari Patient and family notified of of transfer: 02/05/24  Discharge Plan and Services Additional resources added to the After Visit Summary for                                       Social Drivers of Health (SDOH) Interventions SDOH Screenings   Food Insecurity: No Food Insecurity (02/02/2024)  Housing: Low Risk  (02/02/2024)  Transportation Needs: No Transportation Needs (02/02/2024)  Utilities: Not At Risk (02/02/2024)  Financial Resource Strain: Low Risk  (06/15/2023)   Received from [facility]  Physical Activity: Unknown (06/15/2023)   Received from [facility]  Social Connections: Moderately Integrated (02/02/2024)  Stress: No Stress Concern Present (06/15/2023)   Received from [facility]  Tobacco Use: Medium Risk (02/02/2024)     Readmission Risk Interventions     No data to display

## 2024-02-05 NOTE — TOC Progression Note (Signed)
 Transition of Care [facility]) - Progression Note    Patient Details  Name: Kristen Allen MRN: [id number] Date of Birth: [birth year]  [facility]) CM/SW Contact  Bridget Cordella Simmonds, LCSW Phone Number: 02/05/2024, 10:39 AM  Clinical Narrative:   CSW confirmed with [facility] that they can receive pt today. MD informed.    Expected Discharge Plan: Skilled Nursing Facility Barriers to Discharge: Continued Medical Work up               Expected Discharge Plan and Services       Living arrangements for the past 2 months: Single Family Home                                       Social Drivers of Health (SDOH) Interventions SDOH Screenings   Food Insecurity: No Food Insecurity (02/02/2024)  Housing: Low Risk  (02/02/2024)  Transportation Needs: No Transportation Needs (02/02/2024)  Utilities: Not At Risk (02/02/2024)  Financial Resource Strain: Low Risk  (06/15/2023)   Received from [facility]  Physical Activity: Unknown (06/15/2023)   Received from [facility]  Social Connections: Moderately Integrated (02/02/2024)  Stress: No Stress Concern Present (06/15/2023)   Received from [facility]  Tobacco Use: Medium Risk (02/02/2024)    Readmission Risk Interventions     No data to display

## 2024-02-05 NOTE — Care Management Important Message (Signed)
 Important Message  Patient Details  Name: Kristen Allen MRN: [id number] Date of Birth: Dec 10, 1931   Important Message Given:  Yes - Medicare IM     Jon Cruel 02/05/2024, 4:37 PM

## 2024-02-05 NOTE — Plan of Care (Signed)
   Problem: Activity: Goal: Risk for activity intolerance will decrease Outcome: Progressing   Problem: Nutrition: Goal: Adequate nutrition will be maintained Outcome: Progressing   Problem: Pain Managment: Goal: General experience of comfort will improve and/or be controlled Outcome: Progressing

## 2024-02-05 NOTE — Discharge Summary (Signed)
 Physician Discharge Summary   Patient: Kristen Allen MRN: [id number] DOB: 1932/03/04  Admit date:     02/02/2024  Discharge date: 02/05/24  Discharge Physician: Lebron JINNY Cage   PCP: Abran Jon CROME, MD   Recommendations at discharge:   Follow-up with PCP in 1 week Follow-up with orthopedics as scheduled  Discharge Diagnoses: Principal Problem:   Left trimalleolar fracture, closed, initial encounter    Hospital Course: 89 year old female with medical history significant for colon cancer s/p resection in the 90s, GERD, right-sided sciatica, recent right wrist injury s/p repair on 12/19/2023 presented to the ED due to a possible mechanical fall resulting in twisting her ankle. Patient complained of severe pain and was brought to the ED. In the ED, BP was somewhat uncontrolled otherwise stable. Labs fairly unremarkable. CT head/cervical spine unremarkable, CT left ankle showed comminuted displaced trimalleolar fracture with disruption of the ankle. Orthopedics consulted. Patient admitted for further management.    Today, patient denied any new complaints.  Stable to discharge to SNF.    Assessment and Plan: Likely orthostasis contributing to recurrent falls Recurrent falls resulting to fractures Possibly orthostatic from taking HCTZ as an elderly patient Discontinued hydrochlorothiazide  Unable to do orthostatic vitals due to nonweightbearing Fall precautions   Trimalleolar left ankle fracture s/p ORIF on 02/02/2024 Management per orthopedics Cautious pain control PT/OT   Possible UTI Reports suprapubic pressure, not retaining UA unremarkable, UCx no growth   Hypertension BP somewhat stable Continue metoprolol , discontinue hydrochlorothiazide    Normocytic anemia Acute blood loss anemia Possible acute blood loss from recent surgery Anemia panel WNL Repeat/follow CBC       Pain control - [place]  Controlled Substance Reporting System database was  reviewed. and patient was instructed, not to drive, operate heavy machinery, perform activities at heights, swimming or participation in water activities or provide baby-sitting services while on Pain, Sleep and Anxiety Medications; until their outpatient Physician has advised to do so again. Also recommended to not to take more than prescribed Pain, Sleep and Anxiety Medications.     Consultants: Orthopedics Procedures performed: ORIF as above Disposition: Skilled nursing facility Diet recommendation:  Regular diet    DISCHARGE MEDICATION: Allergies as of 02/05/2024       Reactions   Amlodipine Swelling   Aspirin Other (See Comments)   Vasculitis, thrombocytopenia   Codeine Nausea Only   Erythromycin Rash        Medication List     STOP taking these medications    hydrochlorothiazide  12.5 MG tablet Commonly known as: HYDRODIURIL    ondansetron  4 MG disintegrating tablet Commonly known as: ZOFRAN -ODT       TAKE these medications    5-Hydroxytryptophan 100 MG Caps Take by mouth.   cetirizine 10 MG tablet Commonly known as: ZYRTEC Take 10 mg by mouth daily.   Cholecalciferol 50 MCG (2000 UT) Caps Take 2,000 Units by mouth daily.   enoxaparin 40 MG/0.4ML injection Commonly known as: LOVENOX Inject 0.4 mLs (40 mg total) into the skin daily for 14 days.   HYDROcodone-acetaminophen  5-325 MG tablet Commonly known as: NORCO/VICODIN Take 1 tablet by mouth 3 (three) times daily as needed for moderate pain (pain score 4-6).   ibuprofen  200 MG tablet Commonly known as: ADVIL  Take 400-800 mg by mouth 2 (two) times daily as needed for mild pain (pain score 1-3).   lidocaine  5 % Commonly known as: LIDODERM  Place 1 patch onto the skin daily. Remove & Discard patch within 12 hours or as directed by  MD Start taking on: February 06, 2024   metoprolol  succinate 25 MG 24 hr tablet Commonly known as: TOPROL -XL Take 50 mg by mouth daily.   polyethylene glycol 17 g  packet Commonly known as: MiraLax Take 17 g by mouth daily as needed.   vitamin B-12 250 MCG tablet Commonly known as: CYANOCOBALAMIN Take 250 mcg by mouth daily.        Follow-up Information     Jule Ronal CROME, PA-C. Schedule an appointment as soon as possible for a visit in 2 week(s).   Specialty: Orthopedic Surgery Contact information: [address] [place]  [place] [address] [phone]         Abran Jon CROME, MD. Schedule an appointment as soon as possible for a visit in 1 week(s).   Specialty: Internal Medicine Contact information: [address] [place] [address] [phone]                Discharge Exam: Fredricka Weights   02/02/24 0325 02/02/24 1306  Weight: 61.2 kg 61.2 kg   General: NAD  Cardiovascular: S1, S2 present Respiratory: CTAB Abdomen: Soft, nontender, nondistended, bowel sounds present Musculoskeletal: LLE dressing intact Skin: Normal Psychiatry: Normal mood   Condition at discharge: stable  The results of significant diagnostics from this hospitalization (including imaging, microbiology, ancillary and laboratory) are listed below for reference.   Imaging Studies: DG Ankle Complete Left Result Date: 02/02/2024 CLINICAL DATA:  Known ankle fracture EXAM: LEFT ANKLE COMPLETE - 3+ VIEW COMPARISON:  Films from earlier in the same day FLUOROSCOPY TIME:  Radiation Exposure Index (as provided by the fluoroscopic device): 0.2 mGy If the device does not provide the exposure index: Fluoroscopy Time:  43 seconds Number of Acquired Images:  4 FINDINGS: Initial images again demonstrate distal tibial and fibular fractures. Fixation sideplate was then placed along the distal fibula. Fixation screws are also noted traversing the medial malleolus. Fracture fragments are in near anatomic alignment. IMPRESSION: ORIF of distal tibial and fibular fractures. Electronically Signed   By: Oneil Devonshire M.D.   On: 02/02/2024 20:03   DG C-Arm  1-60 Min-No Report Result Date: 02/02/2024 Fluoroscopy was utilized by the requesting physician.  No radiographic interpretation.   CT Ankle Left Wo Contrast Result Date: 02/02/2024 CLINICAL DATA:  Status post reduction EXAM: CT OF THE LEFT ANKLE WITHOUT CONTRAST TECHNIQUE: Multidetector CT imaging of the left ankle was performed according to the standard protocol. Multiplanar CT image reconstructions were also generated. RADIATION DOSE REDUCTION: This exam was performed according to the departmental dose-optimization program which includes automated exposure control, adjustment of the mA and/or kV according to patient size and/or use of iterative reconstruction technique. COMPARISON:  Left ankle radiograph dated 02/02/2024 FINDINGS: Bones/Joint/Cartilage Comminuted displaced trimalleolar fracture. Comminuted fracture of the distal fibula with 1 full shaft width posterior displacement of the dominant fracture fragment with 1.1 cm foreshortening and mild apex medial angulation. Comminuted fracture of the medial and posterior tibia with disruption of the ankle mortise. The medial fracture fragment is medially displaced by 9 mm relative to the proximal tibia and the posterior fracture fragment is distracted by 1.2 cm. Punctate irregular radiodensities projecting medial to the navicular may represent accessory ossicles. Ligaments Suboptimally assessed by CT. The anterior inferior tibiofibular ligament is disrupted. Muscles and Tendons Grossly intact. Soft tissues Subcutaneous soft tissue stranding, predominantly involving the lateral lower leg and ankle. IMPRESSION: Comminuted displaced trimalleolar fracture with disruption of the ankle mortise. Electronically Signed   By: Limin  Xu  M.D.   On: 02/02/2024 09:22   CT Cervical Spine Wo Contrast Result Date: 02/02/2024 EXAM: CT CERVICAL SPINE WITHOUT CONTRAST 02/02/2024 05:11:42 AM TECHNIQUE: CT of the cervical spine was performed without the administration of  intravenous contrast. Multiplanar reformatted images are provided for review. Automated exposure control, iterative reconstruction, and/or weight based adjustment of the mA/kV was utilized to reduce the radiation dose to as low as reasonably achievable. COMPARISON: Head CT 02/02/2024 CLINICAL HISTORY: 89 year old female. Polytrauma, blunt. Fall, denies HA or neck pain. FINDINGS: CERVICAL SPINE: BONES AND ALIGNMENT: No acute fracture or traumatic malalignment. Mild degenerative anterolisthesis of the cervical spine most pronounced at C4-C5. Chronic bilateral posterior element ankylosis at C4-C5. Osteopenia. DEGENERATIVE CHANGES: Advanced cervical facet arthropathy elsewhere, with evidence of developing degenerative facet ankylosis on the right also at C2-C3. Bulky degenerative partially calcified ligamentous hypertrophy about the odontoid. No significant cervical spinal stenosis by CT. SOFT TISSUES: No prevertebral soft tissue swelling. Negative visible noncontrast thoracic inlet. IMPRESSION: 1. No acute cervical spine injury identified. 2. Cervical spondylosis without spinal canal stenosis by CT. Electronically signed by: Helayne Hurst MD 02/02/2024 05:21 AM EDT RP Workstation: [id number]   CT Head Wo Contrast Result Date: 02/02/2024 EXAM: CT HEAD WITHOUT CONTRAST 02/02/2024 05:11:42 AM TECHNIQUE: CT of the head was performed without the administration of intravenous contrast. Automated exposure control, iterative reconstruction, and/or weight based adjustment of the mA/kV was utilized to reduce the radiation dose to as low as reasonably achievable. COMPARISON: None available. CLINICAL HISTORY: 89 year old female. Syncope/presyncope, cerebrovascular cause suspected. Fall, denies HA or neck pain. FINDINGS: BRAIN AND VENTRICLES: No acute hemorrhage. No evidence of acute infarct. No hydrocephalus. No extra-axial collection. No mass effect or midline shift. Normal brain volume per age. Patchy frontal lobe white matter  hypodensity, mild or at most moderate for age. No cortical encephalomalacia. No suspicious intracranial vascular hyperdensity. ORBITS: No acute abnormality. No convincing acute orbital soft tissue injury. SINUSES: No acute abnormality. SOFT TISSUES AND SKULL: No acute soft tissue abnormality. No convincing acute scalp soft tissue injury. No skull fracture. IMPRESSION: 1. No acute traumatic injury identified. 2. Negative for age noncontrast CT appearance of the brain. Electronically signed by: Helayne Hurst MD 02/02/2024 05:17 AM EDT RP Workstation: [id number]   DG Chest Portable 1 View Result Date: 02/02/2024 EXAM: 1 VIEW(S) XRAY OF THE CHEST 02/02/2024 04:16:00 AM COMPARISON: Chest radiographs 01/17/2008 and earlier. CLINICAL HISTORY: 89 year old female with pain and post reduction status. FINDINGS: LUNGS AND PLEURA: Lower lung volumes. Confluent left lung base opacity laterally but much of the left hemidiaphragm remains visible this is indeterminate for left pleural effusion versus airspace disease. No pulmonary edema. No pneumothorax. HEART AND MEDIASTINUM: Mild to moderate cardiomegaly appears new or increased since 2019. Other mediastinal contours within normal limits. BONES AND SOFT TISSUES: No acute osseous abnormality. IMPRESSION: 1. Left basilar opacity, indeterminate for pleural effusion versus airspace disease. And Aspiration not excluded. 2. Mild to moderate cardiomegaly, new/increased since 2019. Electronically signed by: Helayne Hurst MD 02/02/2024 04:31 AM EDT RP Workstation: [id number]   DG Ankle Complete Left Result Date: 02/02/2024 EXAM: 3 VIEW(S) XRAY OF THE LEFT ANKLE 02/02/2024 04:16:00 AM CLINICAL HISTORY: 89 year old female. Pain. Post reduction. COMPARISON: None available. FINDINGS: BONES AND JOINTS: Trimalleolar fracture. Posterior dislocation of the ankle joint. Lateral subluxation of the ankle. Fracture fragments are displaced and angulated. The talus and calcaneus appear to remain  intact. No focal osseous lesion. SOFT TISSUES: Overlying cast/splint material. IMPRESSION: 1.  Trimalleolar fracture with posterior dislocation and lateral subluxation of the ankle joint. Displaced and angulated fracture fragments. 2. Cast or splint in place. Electronically signed by: Helayne Hurst MD 02/02/2024 04:28 AM EDT RP Workstation: [id number]   XR Wrist Complete Right Result Date: 01/17/2024 Right wrist x-rays demonstrate stable appearance of the distal radius hardware fixation, no evidence of hardware failure or migration.  Mild residual ulnar positivity.   Microbiology: Results for orders placed or performed during the hospital encounter of 02/02/24  Surgical pcr screen     Status: None   Collection Time: 02/02/24  9:12 AM   Specimen: Nasal Mucosa; Nasal Swab  Result Value Ref Range Status   MRSA, PCR NEGATIVE NEGATIVE Final   Staphylococcus aureus NEGATIVE NEGATIVE Final    Comment: (NOTE) The Xpert SA Assay (FDA approved for NASAL specimens in patients 76 years of age and older), is one component of a comprehensive surveillance program. It is not intended to diagnose infection nor to guide or monitor treatment. Performed at [facility], [address], [place], [place] [address]   Urine Culture (for pregnant, neutropenic or urologic patients or patients with an indwelling urinary catheter)     Status: None   Collection Time: 02/03/24 11:57 AM   Specimen: Urine, Clean Catch  Result Value Ref Range Status   Specimen Description URINE, CLEAN CATCH  Final   Special Requests NONE  Final   Culture   Final    NO GROWTH Performed at [facility], [address], [place], [place] [address]    Report Status 02/04/2024 FINAL  Final    Labs: CBC: Recent Labs  Lab 02/02/24 0343 02/02/24 0405 02/03/24 0150 02/04/24 0605 02/05/24 0426  WBC 6.5  --  7.9 7.8 6.0  NEUTROABS  --   --   --  5.0 3.4  HGB 11.0* 11.2* 9.8* 10.2* 8.8*  HCT 34.1* 33.0* 29.5* 31.0* 26.9*   MCV 95.5  --  93.9 95.7 95.4  PLT 272  --  194 237 201   Basic Metabolic Panel: Recent Labs  Lab 02/02/24 0343 02/02/24 0405 02/03/24 0150 02/04/24 0605  NA 137 138 134* 138  K 3.9 4.1 4.7 4.1  CL 101 100 101 103  CO2 26  --  22 24  GLUCOSE 110* 103* 149* 101*  BUN 14 18 15 12   CREATININE 0.90 0.90 0.83 0.75  CALCIUM 9.2  --  8.8* 8.9   Liver Function Tests: Recent Labs  Lab 02/02/24 0343  AST 21  ALT 16  ALKPHOS 52  BILITOT 0.7  PROT 6.1*  ALBUMIN 3.6   CBG: Recent Labs  Lab 02/03/24 0637  GLUCAP 125*    Discharge time spent: greater than 30 minutes.  Signed: Lebron JINNY Cage, MD [facility] 02/05/2024

## 2024-02-07 ENCOUNTER — Telehealth: Payer: Self-pay | Admitting: Rehabilitative and Restorative Service Providers"

## 2024-02-07 NOTE — Telephone Encounter (Signed)
 Therapist called patient back at her request.  She is now in inpatient rehab facility after breaking her leg.  She wanted to know what is appropriate for her wrist at this time.  OT suggests starting to leave her brace off when just resting, sitting down, even at night if tolerated.  Next week, she will be 8 weeks postop and she could typically start strengthening, so she was encouraged to continue isometric grip training as able.  She was also encouraged to keep stretching her arm when she gets a chance.  Due to new leg injury and likely poor balance, she was asked to have on the orthosis for support and stability during her lower body rehabilitation.  Anytime she is trying to stand or move around she should have it on for protection.  She states understanding directions, and thanked the therapist for the call.  Therapy will be put on hold until she can return, as needed.

## 2024-02-07 NOTE — Anesthesia Postprocedure Evaluation (Addendum)
 Anesthesia Post Note  Patient: Kristen Allen  Procedure(s) Performed: EXTERNAL FIXATION, ANKLE (Left)     Patient location during evaluation: PACU Anesthesia Type: General Level of consciousness: patient cooperative and awake Pain management: pain level controlled Vital Signs Assessment: post-procedure vital signs reviewed and stable Respiratory status: spontaneous breathing, nonlabored ventilation and respiratory function stable Cardiovascular status: blood pressure returned to baseline and stable Postop Assessment: no apparent nausea or vomiting Anesthetic complications: no   No notable events documented.                  Timira Bieda

## 2024-02-13 ENCOUNTER — Other Ambulatory Visit (INDEPENDENT_AMBULATORY_CARE_PROVIDER_SITE_OTHER)

## 2024-02-13 ENCOUNTER — Ambulatory Visit (INDEPENDENT_AMBULATORY_CARE_PROVIDER_SITE_OTHER): Admitting: Orthopedic Surgery

## 2024-02-13 DIAGNOSIS — Z9889 Other specified postprocedural states: Secondary | ICD-10-CM | POA: Diagnosis not present

## 2024-02-13 DIAGNOSIS — Z8781 Personal history of (healed) traumatic fracture: Secondary | ICD-10-CM

## 2024-02-13 NOTE — Progress Notes (Signed)
   Kristen Allen - 89 y.o. female MRN [id number]  Date of birth: 27-Nov-1931  Office Visit Note: Visit Date: 02/13/2024 PCP: Abran Jon CROME, MD Referred by: Abran Jon CROME, MD  Subjective:  HPI: Kristen Allen is a 89 y.o. female who presents today for follow up 8 weeks status post Right distal radius fracture open reduction internal fixation.  Unfortunately, she has recently sustained a left trimalleolar fracture that was fixed by Dr. Jerri.  She is currently in a rehabilitation facility.  Right wrist continues to do well overall, does have some ongoing soreness.  Pertinent ROS were reviewed with the patient and found to be negative unless otherwise specified above in HPI.   Assessment & Plan: Visit Diagnoses:  1. S/P ORIF (open reduction internal fixation) fracture     Plan: Fortunately, based on x-ray workup today and clinical examination, the right wrist continues to demonstrate appropriate interval healing.  Hardware remains well fixated at the right wrist without evidence of failure or migration.  She can begin to wean from the removable splint as tolerated.  Continue with therapeutic exercises with occupational therapy.  Can slowly advance weightbearing as well.  Follow-up with myself in approximately 4 weeks.  Follow-up: No follow-ups on file.   Meds & Orders: No orders of the defined types were placed in this encounter.   Orders Placed This Encounter  Procedures   XR Wrist Complete Right     Procedures: No procedures performed       Objective:   Vital Signs: There were no vitals taken for this visit.  Ortho Exam Right wrist: - Flexion/extension 35/25, slightly improved passively - Composite fist - AIN/PIN/interosseous intact, sensation intact median/radial/ulnar distributions - Pronation supination 60/60, no evidence of DRUJ instability - Moderate pain with ulnar deviation, mild tenderness over the foveal region  Imaging: XR Wrist Complete Right Result Date:  02/13/2024 X-rays of the right wrist demonstrate stable appearance of the orthopedic hardware with interval healing of the distal radius fracture.  There is evidence of residual ulnar positivity.  Previously known degenerative changes are seen at the thumb [facility] and STT regions.    Kristen Allen Kristen Allen, M.D. [facility], Hand Surgery

## 2024-02-14 ENCOUNTER — Telehealth: Payer: Self-pay

## 2024-02-14 ENCOUNTER — Other Ambulatory Visit

## 2024-02-14 ENCOUNTER — Ambulatory Visit (INDEPENDENT_AMBULATORY_CARE_PROVIDER_SITE_OTHER): Admitting: Physician Assistant

## 2024-02-14 DIAGNOSIS — Z9889 Other specified postprocedural states: Secondary | ICD-10-CM

## 2024-02-14 DIAGNOSIS — Z8781 Personal history of (healed) traumatic fracture: Secondary | ICD-10-CM | POA: Diagnosis not present

## 2024-02-14 DIAGNOSIS — S82852A Displaced trimalleolar fracture of left lower leg, initial encounter for closed fracture: Secondary | ICD-10-CM

## 2024-02-14 NOTE — Progress Notes (Signed)
 Post-Op Visit Note   Patient: Kristen Allen           Date of Birth: August 02, 1931           MRN: [id number] Visit Date: 02/14/2024 PCP: Abran Jon CROME, MD   Assessment & Plan:  Chief Complaint:  Chief Complaint  Patient presents with   Left Ankle - Follow-up    ORIF ANKLE (Left) 02/02/2024   Visit Diagnoses:  1. S/P ORIF (open reduction internal fixation) fracture   2. Left trimalleolar fracture, closed, initial encounter     Plan: Patient is a very pleasant 89 year old female who comes in today 2 weeks status post ORIF left ankle fracture.  She has been doing well.  She is at a rehab facility where she has been nonambulatory to the left lower extremity.  She has been elevating is much as she can for pain and swelling.  She has been taking Tylenol  for pain.  She has been getting Lovenox injections for DVT prophylaxis.  Examination of her left ankle reveals well healing surgical incisions with nylon sutures in place.  No evidence of infection or swelling.  Calves are soft nontender.  She does have moderate swelling to the left ankle.  She is neurovascular intact distally.  Today, sutures were removed and Steri-Strips applied.  We have placed her in a cam boot where she will remain nonweightbearing.  She will continue to ice and elevate for pain and swelling.  She will finish out the remaining Lovenox and then transition to a baby aspirin twice daily until she is ambulating to the left lower extremity likely in 4 weeks.  She will follow-up with us  in 4 weeks for repeat evaluation and three-view x-rays of the left ankle.  Call with concerns or questions.  Follow-Up Instructions: Return in about 4 weeks (around 03/13/2024).   Orders:  Orders Placed This Encounter  Procedures   XR Ankle Complete Left   No orders of the defined types were placed in this encounter.   Imaging: XR Ankle Complete Left Result Date: 02/14/2024 X-rays demonstrate stable alignment of the fracture without  hardware complication  XR Wrist Complete Right Result Date: 02/13/2024 X-rays of the right wrist demonstrate stable appearance of the orthopedic hardware with interval healing of the distal radius fracture.  There is evidence of residual ulnar positivity.  Previously known degenerative changes are seen at the thumb [facility] and STT regions.   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  Past Surgical History:  Procedure Laterality Date   ABDOMINAL HYSTERECTOMY     APPENDECTOMY     COLON SURGERY     EXTERNAL FIXATION, ANKLE Left 02/02/2024   Procedure: EXTERNAL FIXATION, ANKLE;  Surgeon: Jerri Kay HERO, MD;  Location: [facility] OR;  Service: Orthopedics;  Laterality: Left;  EX-FIX VS. ORIF   OPEN REDUCTION INTERNAL FIXATION (ORIF) DISTAL RADIAL FRACTURE Right 12/19/2023   Procedure: OPEN  REDUCTION INTERNAL FIXATION (ORIF) DISTAL RADIUS FRACTURE;  Surgeon: Arlinda Buster, MD;  Location: [facility];  Service: Orthopedics;  Laterality: Right;   Social History   Occupational History   Not on file  Tobacco Use   Smoking status: Former    Current packs/day: 1.00    Average packs/day: 1 pack/day for 30.0 years (30.0 ttl pk-yrs)    Types: Cigarettes   Smokeless tobacco: Never  Substance and Sexual Activity   Alcohol use: No   Drug use: No   Sexual activity: Not on file

## 2024-02-14 NOTE — Telephone Encounter (Signed)
 [facility] called and states they told her She will finish out the remaining Lovenox and then transition to a baby aspirin twice daily but states patient is Allergic to Aspirin. Would like to know what to do.   ORIF ANKLE (Left) 02/02/2024    CB [phone]

## 2024-02-15 NOTE — Telephone Encounter (Signed)
 Spoke to [place] and will let [facility] know

## 2024-02-20 ENCOUNTER — Encounter: Admitting: Rehabilitative and Restorative Service Providers"

## 2024-02-21 ENCOUNTER — Telehealth: Payer: Self-pay | Admitting: Orthopaedic Surgery

## 2024-02-21 NOTE — Telephone Encounter (Signed)
 Called and let Kristen Allen know.

## 2024-02-21 NOTE — Telephone Encounter (Signed)
 Can take off to sleep

## 2024-02-21 NOTE — Telephone Encounter (Signed)
 Colleen from the nursing facility called and needs to know if the patient can sleep with the boot or take it off. CB#[phone]

## 2024-02-25 ENCOUNTER — Encounter: Payer: Self-pay | Admitting: Radiology

## 2024-02-26 ENCOUNTER — Encounter: Admitting: Rehabilitative and Restorative Service Providers"

## 2024-03-03 ENCOUNTER — Telehealth: Payer: Self-pay | Admitting: Orthopaedic Surgery

## 2024-03-03 NOTE — Telephone Encounter (Signed)
 Marcey from [facility] health called wanting verbal orders. 1w5 for PT. Call back number is [phone]

## 2024-03-03 NOTE — Telephone Encounter (Signed)
Called and gave verbal orders 

## 2024-03-12 ENCOUNTER — Ambulatory Visit: Admitting: Orthopedic Surgery

## 2024-03-12 ENCOUNTER — Other Ambulatory Visit (INDEPENDENT_AMBULATORY_CARE_PROVIDER_SITE_OTHER): Payer: Self-pay

## 2024-03-12 DIAGNOSIS — Z8781 Personal history of (healed) traumatic fracture: Secondary | ICD-10-CM

## 2024-03-12 DIAGNOSIS — Z9889 Other specified postprocedural states: Secondary | ICD-10-CM

## 2024-03-12 NOTE — Progress Notes (Signed)
   Kristen Allen - 89 y.o. female MRN [id number]  Date of birth: 09-21-1931  Office Visit Note: Visit Date: 03/12/2024 PCP: Abran Jon CROME, MD Referred by: Abran Jon CROME, MD  Subjective:  HPI: Kristen Allen is a 89 y.o. female who presents today for follow up 12 weeks status post right distal radius fracture open reduction internal fixation, intra-articular 3+ fragments.  Doing well overall, pain is controlled at the right wrist.  She has transitioned away from the splint at this time.  Does have some soreness with utilizing her wheelchair.  Pertinent ROS were reviewed with the patient and found to be negative unless otherwise specified above in HPI.   Assessment & Plan: Visit Diagnoses:  1. S/P ORIF (open reduction internal fixation) fracture     Plan: She continues to do well postoperatively.  At this juncture, she can continue with activity as tolerated.  No restrictions at this time.  Follow-up as needed.  Follow-up: No follow-ups on file.   Meds & Orders: No orders of the defined types were placed in this encounter.   Orders Placed This Encounter  Procedures   XR Wrist Complete Right     Procedures: No procedures performed       Objective:   Vital Signs: There were no vitals taken for this visit.  Ortho Exam Right wrist: - Flexion/extension 55/40, composite fist without restriction - AIN/PIN/interosseous intact, sensation intact median/radial/ulnar distributions - Pronation supination 60/60, no evidence of DRUJ instability - Minimal pain with ulnar deviation, mild tenderness over the foveal region  Imaging: XR Wrist Complete Right Result Date: 03/12/2024 X-rays of the right wrist demonstrate stable appearance of the orthopedic hardware with interval healing of the distal radius fracture. There is evidence of residual ulnar positivity. Previously known degenerative changes are seen at the thumb [facility] and STT regions.     Marilea Gwynne [name], M.D. [facility], Hand Surgery

## 2024-03-18 ENCOUNTER — Ambulatory Visit (INDEPENDENT_AMBULATORY_CARE_PROVIDER_SITE_OTHER): Admitting: Orthopaedic Surgery

## 2024-03-18 ENCOUNTER — Other Ambulatory Visit (INDEPENDENT_AMBULATORY_CARE_PROVIDER_SITE_OTHER): Payer: Self-pay

## 2024-03-18 DIAGNOSIS — G8929 Other chronic pain: Secondary | ICD-10-CM | POA: Diagnosis not present

## 2024-03-18 DIAGNOSIS — M25572 Pain in left ankle and joints of left foot: Secondary | ICD-10-CM | POA: Diagnosis not present

## 2024-03-18 DIAGNOSIS — Z9889 Other specified postprocedural states: Secondary | ICD-10-CM

## 2024-03-18 DIAGNOSIS — S82852A Displaced trimalleolar fracture of left lower leg, initial encounter for closed fracture: Secondary | ICD-10-CM

## 2024-03-18 DIAGNOSIS — Z8781 Personal history of (healed) traumatic fracture: Secondary | ICD-10-CM

## 2024-03-18 NOTE — Progress Notes (Signed)
 Post-Op Visit Note   Patient: Kristen Allen           Date of Birth: 1931-07-19           MRN: [id number] Visit Date: 03/18/2024 PCP: Abran Jon CROME, MD   Assessment & Plan:  Chief Complaint:  Chief Complaint  Patient presents with   Left Ankle - Routine Post Op    02/02/2024-External Fixation, Ankle - Left   Visit Diagnoses:  1. Chronic pain of left ankle   2. Left trimalleolar fracture, closed, initial encounter   3. S/P ORIF (open reduction internal fixation) fracture     Plan: History of Present Illness Kristen Allen is a 89 year old female who presents for follow-up of her ankle surgery.    She is six weeks post ankle surgery and has twitching and nerve spasms in the leg, especially at night while lying down, causing whole-leg jerks focused in the calf. Ibuprofen  helps her sleep.  She has swelling in the foot and ankle that improves when she elevates the leg while sitting.  She has remained non-weight bearing on the ankle for six weeks and has difficulty performing bilateral exercises from her home program due to current limitations.  She plans to continue home health physical therapy.  Examination shows fully healed surgical scars.  Expected postoperative swelling.  No signs of infection.  Results RADIOLOGY Ankle X-ray: Fracture minimally visible, predominantly healed (03/18/2024)  Assessment and Plan Left ankle fracture, post-surgical recovery with associated pain and swelling Six weeks post-surgery. Reports nocturnal twitching and nerve spasms managed with ibuprofen . X-rays show significant healing. Cleared for weight-bearing. Muscle weakness due to non-weight bearing. - Allowed weight-bearing on left ankle. - Ordered home physical therapy for gait training and ankle rehab. - Instructed to wear boot when weight-bearing, remove when not. - Scheduled follow-up in six weeks with repeat imaging.  Follow-Up Instructions: Return in about 6 weeks (around  04/29/2024) for with lindsey.   Orders:  Orders Placed This Encounter  Procedures   XR Ankle Complete Left   No orders of the defined types were placed in this encounter.   Imaging: XR Ankle Complete Left Result Date: 03/18/2024 Xrays show status post ORIF left bimalleolar ankle fracture with intact hardware.  Fractures have demonstrated healing.     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  Past Surgical History:  Procedure Laterality Date   ABDOMINAL HYSTERECTOMY     APPENDECTOMY     COLON SURGERY     EXTERNAL FIXATION, ANKLE Left 02/02/2024   Procedure: EXTERNAL FIXATION, ANKLE;  Surgeon: Jerri Kay HERO, MD;  Location: [facility] OR;  Service: Orthopedics;  Laterality: Left;  EX-FIX VS. ORIF   OPEN REDUCTION INTERNAL FIXATION (ORIF) DISTAL RADIAL FRACTURE Right 12/19/2023   Procedure: OPEN REDUCTION INTERNAL FIXATION (ORIF) DISTAL RADIUS FRACTURE;  Surgeon: Arlinda Buster, MD;  Location: [facility];  Service: Orthopedics;  Laterality: Right;   Social History   Occupational History   Not on file  Tobacco Use   Smoking status: Former    Current packs/day: 1.00    Average packs/day: 1 pack/day for 30.0 years (30.0 ttl pk-yrs)    Types: Cigarettes   Smokeless tobacco: Never  Substance and Sexual Activity   Alcohol use: No   Drug use: No   Sexual activity: Not on file

## 2024-04-03 ENCOUNTER — Telehealth: Payer: Self-pay | Admitting: Orthopaedic Surgery

## 2024-04-03 NOTE — Telephone Encounter (Signed)
 Pt called saying that she has questions about her shoe not fitting andacauseing her pain when she tries to wear it. Call back number is [phone].

## 2024-04-03 NOTE — Telephone Encounter (Signed)
Tried to call. No answer. No voicemail.

## 2024-04-04 NOTE — Telephone Encounter (Signed)
 Spoke with patient. She feels like her foot moves around in the boot. We talked about using the extra padding that came with the boot, and also how to inflate the foot to add more compression. She will call if she has any other questions.

## 2024-04-29 ENCOUNTER — Ambulatory Visit (INDEPENDENT_AMBULATORY_CARE_PROVIDER_SITE_OTHER): Admitting: Physician Assistant

## 2024-04-29 ENCOUNTER — Other Ambulatory Visit: Payer: Self-pay

## 2024-04-29 DIAGNOSIS — M25572 Pain in left ankle and joints of left foot: Secondary | ICD-10-CM

## 2024-04-29 DIAGNOSIS — G8929 Other chronic pain: Secondary | ICD-10-CM

## 2024-04-29 NOTE — Progress Notes (Signed)
 "  Post-Op Visit Note   Patient: Kristen Allen           Date of Birth: Dec 12, 1931           MRN: [id number] Visit Date: 04/29/2024 PCP: Abran Jon CROME, MD   Assessment & Plan:  Chief Complaint:  Chief Complaint  Patient presents with   Left Ankle - Follow-up    ORIF ANKLE (Left) 02/02/2024   Visit Diagnoses:  1. Chronic pain of left ankle     Plan: Patient is a pleasant 89 year old female who comes in today 3 months status post ORIF left ankle fracture.  She has been doing well.  She has been weightbearing as tolerated in the cam boot out in public but has been weightbearing at home without it.  She is having no pain.  She does still have some swelling to the left foot and ankle.  Currently ambulating with a rollator.  Of note, she was ambulating without assistance prior to her injury.  She has recently finished her home health PT.  Examination of the left ankle reveals moderate swelling.  Calf soft nontender.  No tenderness about the entire ankle.  Painless range of motion.  She is neurovascular intact distally.  This point, I believe she is clinically healed.  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  I have provided her with an ASO brace to wear for support.  Follow-up as needed.  Follow-Up Instructions: Return if symptoms worsen or fail to improve.   Orders:  Orders Placed This Encounter  Procedures   XR Ankle Complete Left   No orders of the defined types were placed in this encounter.   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  Continued consolidation to the distal fibula.  No hardware complication.   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  Past Surgical History:  Procedure Laterality Date   ABDOMINAL HYSTERECTOMY     APPENDECTOMY     COLON SURGERY     EXTERNAL FIXATION, ANKLE Left 02/02/2024   Procedure: EXTERNAL FIXATION, ANKLE;  Surgeon: Jerri Kay HERO, MD;  Location: [facility] OR;  Service: Orthopedics;  Laterality: Left;  EX-FIX VS. ORIF   OPEN REDUCTION INTERNAL FIXATION (ORIF) DISTAL RADIAL FRACTURE Right 12/19/2023   Procedure: OPEN REDUCTION INTERNAL FIXATION (ORIF) DISTAL RADIUS FRACTURE;  Surgeon: Arlinda Buster, MD;  Location: [facility];  Service: Orthopedics;  Laterality: Right;   Social History   Occupational History   Not on file  Tobacco Use   Smoking status: Former    Current packs/day: 1.00    Average packs/day: 1 pack/day for 30.0 years (30.0 ttl pk-yrs)    Types: Cigarettes   Smokeless tobacco: Never  Substance and Sexual Activity  Alcohol use: No   Drug use: No   Sexual activity: Not on file     "
